# Patient Record
Sex: Male | Born: 1975 | Race: Black or African American | Hispanic: No | Marital: Married | Smoking: Current every day smoker
Health system: Southern US, Community
[De-identification: ages and names within clinical notes are randomized; demographics above are authoritative.]

## PROBLEM LIST (undated history)

## (undated) DIAGNOSIS — I739 Peripheral vascular disease, unspecified: Secondary | ICD-10-CM

## (undated) DIAGNOSIS — M8700 Idiopathic aseptic necrosis of unspecified bone: Secondary | ICD-10-CM

## (undated) DIAGNOSIS — Z21 Asymptomatic human immunodeficiency virus [HIV] infection status: Secondary | ICD-10-CM

## (undated) DIAGNOSIS — B20 Human immunodeficiency virus [HIV] disease: Secondary | ICD-10-CM

## (undated) HISTORY — PX: HERNIA REPAIR: SHX51

---

## 1999-08-19 ENCOUNTER — Emergency Department (HOSPITAL_COMMUNITY): Admission: EM | Admit: 1999-08-19 | Discharge: 1999-08-19 | Payer: Self-pay | Admitting: Emergency Medicine

## 2005-12-05 ENCOUNTER — Emergency Department (HOSPITAL_COMMUNITY): Admission: EM | Admit: 2005-12-05 | Discharge: 2005-12-05 | Payer: Self-pay | Admitting: Emergency Medicine

## 2009-08-26 ENCOUNTER — Emergency Department (HOSPITAL_BASED_OUTPATIENT_CLINIC_OR_DEPARTMENT_OTHER): Admission: EM | Admit: 2009-08-26 | Discharge: 2009-08-26 | Payer: Self-pay | Admitting: Emergency Medicine

## 2009-08-30 ENCOUNTER — Emergency Department (HOSPITAL_BASED_OUTPATIENT_CLINIC_OR_DEPARTMENT_OTHER): Admission: EM | Admit: 2009-08-30 | Discharge: 2009-08-30 | Payer: Self-pay | Admitting: Emergency Medicine

## 2010-10-26 ENCOUNTER — Emergency Department (HOSPITAL_BASED_OUTPATIENT_CLINIC_OR_DEPARTMENT_OTHER)
Admission: EM | Admit: 2010-10-26 | Discharge: 2010-10-26 | Disposition: A | Discharge status: Home / Self Care | Payer: Self-pay | Attending: Emergency Medicine | Admitting: Emergency Medicine

## 2010-10-26 ENCOUNTER — Emergency Department (INDEPENDENT_AMBULATORY_CARE_PROVIDER_SITE_OTHER): Payer: Self-pay

## 2010-10-26 DIAGNOSIS — M25559 Pain in unspecified hip: Secondary | ICD-10-CM | POA: Insufficient documentation

## 2010-10-26 DIAGNOSIS — M25519 Pain in unspecified shoulder: Secondary | ICD-10-CM | POA: Insufficient documentation

## 2010-10-26 DIAGNOSIS — M87029 Idiopathic aseptic necrosis of unspecified humerus: Secondary | ICD-10-CM

## 2010-10-26 DIAGNOSIS — M2550 Pain in unspecified joint: Secondary | ICD-10-CM | POA: Insufficient documentation

## 2011-02-03 ENCOUNTER — Encounter: Payer: Self-pay | Admitting: Family Medicine

## 2011-02-03 ENCOUNTER — Emergency Department (HOSPITAL_BASED_OUTPATIENT_CLINIC_OR_DEPARTMENT_OTHER)
Admission: EM | Admit: 2011-02-03 | Discharge: 2011-02-03 | Disposition: A | Discharge status: Home / Self Care | Payer: Self-pay | Attending: Emergency Medicine | Admitting: Emergency Medicine

## 2011-02-03 DIAGNOSIS — K047 Periapical abscess without sinus: Secondary | ICD-10-CM | POA: Insufficient documentation

## 2011-02-03 DIAGNOSIS — K029 Dental caries, unspecified: Secondary | ICD-10-CM | POA: Insufficient documentation

## 2011-02-03 DIAGNOSIS — K089 Disorder of teeth and supporting structures, unspecified: Secondary | ICD-10-CM | POA: Insufficient documentation

## 2011-02-03 MED ORDER — CLINDAMYCIN HCL 300 MG PO CAPS
300.0000 mg | ORAL_CAPSULE | Freq: Once | ORAL | Status: AC
  Administered 2011-02-03: 300 mg via ORAL

## 2011-02-03 MED ORDER — CLINDAMYCIN HCL 300 MG PO CAPS: 300.0000 mg | ORAL_CAPSULE | Freq: Three times a day (TID) | ORAL | Status: DC

## 2011-02-03 MED ORDER — CLINDAMYCIN HCL 300 MG PO CAPS: 300.0000 mg | ORAL_CAPSULE | Freq: Three times a day (TID) | ORAL | Status: AC

## 2011-02-03 MED ORDER — CLINDAMYCIN HCL 150 MG PO CAPS
ORAL_CAPSULE | ORAL | Status: AC
  Administered 2011-02-03: 300 mg via ORAL
  Filled 2011-02-03: qty 2

## 2011-02-03 MED ORDER — HYDROCODONE-ACETAMINOPHEN 5-500 MG PO TABS: 1.0000 | ORAL_TABLET | Freq: Four times a day (QID) | ORAL | Status: AC | PRN

## 2011-02-03 NOTE — ED Notes (Signed)
Pt c/o right lower tooth pain x 3 days. Pt sts he has dental appt "next week".

## 2011-02-03 NOTE — ED Provider Notes (Addendum)
History     Chief Complaint  Patient presents with  . Dental Pain   HPI 35 year old male presents to the ER with complaint of with 3 days of right lower dental pain. Patient reports this morning he noted a swelling adjacent to the sore tooth, and pushed on the area and had drainage of blood. Patient reports he has a dental appointment next week. No fever. Patient with multiple allergies to antibiotics, but does not think he is allergic to clindamycin.    History reviewed. No pertinent past medical history.  Past Surgical History  Procedure Date  . Hernia repair     No family history on file.  History  Substance Use Topics  . Smoking status: Never Smoker   . Smokeless tobacco: Not on file  . Alcohol Use: No      Review of Systems  Constitutional: Negative.   HENT: Positive for dental problem. Negative for ear pain, mouth sores, trouble swallowing, neck pain, neck stiffness and voice change.   Eyes: Negative.   Respiratory: Negative.   Cardiovascular: Negative.   Gastrointestinal: Negative.   Genitourinary: Negative.   Musculoskeletal: Negative.   Skin: Negative.   Neurological: Negative.   Hematological: Negative.   Psychiatric/Behavioral: Negative.     Physical Exam  BP 106/81  Pulse 81  Temp(Src) 98.2 F (36.8 C) (Oral)  Resp 18  Ht 6\' 3"  (1.905 m)  Wt 245 lb (111.131 kg)  BMI 30.62 kg/m2  SpO2 99%  Physical Exam  Constitutional: He appears well-developed and well-nourished.  HENT:  Head: Normocephalic and atraumatic.  Mouth/Throat:    Eyes: Conjunctivae and EOM are normal. Pupils are equal, round, and reactive to light.  Neck: Normal range of motion.  Cardiovascular: Normal rate, regular rhythm, normal heart sounds and intact distal pulses.   Pulmonary/Chest: Effort normal and breath sounds normal.  Lymphadenopathy:    He has no cervical adenopathy.    ED Course  Procedures  MDM Patient with small dental abscess at lower right first molar.  Patient instructed to keep his current dental appointment, and followup closely. Patient to be started on antibiotics and pain control. Patient updated on reasons for return to the emergency department. Patient advised to call the dental office and see if he can be seen sooner for his developing dental abscess.      Olivia Mackie, MD 02/03/11 1156  Olivia Mackie, MD 03/07/11 [phone]

## 2011-02-26 ENCOUNTER — Encounter (HOSPITAL_BASED_OUTPATIENT_CLINIC_OR_DEPARTMENT_OTHER): Payer: Self-pay | Admitting: *Deleted

## 2011-02-26 ENCOUNTER — Emergency Department (HOSPITAL_BASED_OUTPATIENT_CLINIC_OR_DEPARTMENT_OTHER)
Admission: EM | Admit: 2011-02-26 | Discharge: 2011-02-26 | Disposition: A | Discharge status: Home / Self Care | Payer: Self-pay | Attending: Emergency Medicine | Admitting: Emergency Medicine

## 2011-02-26 DIAGNOSIS — R6884 Jaw pain: Secondary | ICD-10-CM | POA: Insufficient documentation

## 2011-02-26 DIAGNOSIS — R220 Localized swelling, mass and lump, head: Secondary | ICD-10-CM | POA: Insufficient documentation

## 2011-02-26 DIAGNOSIS — K1370 Unspecified lesions of oral mucosa: Secondary | ICD-10-CM | POA: Insufficient documentation

## 2011-02-26 DIAGNOSIS — K1379 Other lesions of oral mucosa: Secondary | ICD-10-CM

## 2011-02-26 DIAGNOSIS — Z9889 Other specified postprocedural states: Secondary | ICD-10-CM | POA: Insufficient documentation

## 2011-02-26 MED ORDER — LIDOCAINE-EPINEPHRINE 2 %-1:100000 IJ SOLN
20.0000 mL | Freq: Once | INTRAMUSCULAR | Status: AC
  Administered 2011-02-26: 20 mL
  Filled 2011-02-26: qty 1

## 2011-02-26 NOTE — ED Provider Notes (Signed)
History     CSN: [id number] Arrival date & time: 02/26/2011  3:56 PM  Chief Complaint  Patient presents with  . Dental Problem   Patient is a 35 y.o. male presenting with tooth pain. The history is provided by the patient.  Dental PainThe primary symptoms include oral bleeding. Primary symptoms do not include headaches, fever or shortness of breath. The symptoms began 1 to 2 hours ago. The symptoms are unchanged. The symptoms occur constantly.  Additional symptoms include: gum swelling, gum tenderness, trismus and jaw pain.  Had dental extraction this am at [facility], no bleeding in office, since he went home has had persistent bleeding and pain is coming back, he states he had 3 teeth pulled on the R side and that the packing fell out. No ASA or anticoagulation and denies any known h/o bleeding problems.   History reviewed. No pertinent past medical history.  Past Surgical History  Procedure Date  . Hernia repair     No family history on file.  History  Substance Use Topics  . Smoking status: Never Smoker   . Smokeless tobacco: Not on file  . Alcohol Use: No      Review of Systems  Constitutional: Negative for fever and chills.  HENT: Negative for neck pain and neck stiffness.   Eyes: Negative for pain.  Respiratory: Negative for shortness of breath.   Cardiovascular: Negative for chest pain.  Gastrointestinal: Negative for abdominal pain.  Genitourinary: Negative for dysuria.  Musculoskeletal: Negative for back pain.  Skin: Negative for rash.  Neurological: Negative for headaches.  Hematological: Does not bruise/bleed easily.  All other systems reviewed and are negative.    Physical Exam  BP 131/84  Pulse 109  Temp(Src) 99 F (37.2 C) (Oral)  Resp 22  SpO2 100%  Physical Exam  Constitutional: He is oriented to person, place, and time. He appears well-developed and well-nourished.  HENT:  Head: Normocephalic and atraumatic.       S/p dental extraction  R upper and lower third molars with active bleeding at both sites, moderate trismus, moderate gingival edema both sites. .    Eyes: Conjunctivae and EOM are normal. Pupils are equal, round, and reactive to light.  Neck: Trachea normal. Neck supple. No thyromegaly present.  Cardiovascular: Normal rate, regular rhythm, S1 normal, S2 normal and normal pulses.     No systolic murmur is present   No diastolic murmur is present  Pulses:      Radial pulses are 2+ on the right side, and 2+ on the left side.  Pulmonary/Chest: Effort normal and breath sounds normal. He has no rhonchi.  Abdominal: Soft. Normal appearance and bowel sounds are normal. There is no tenderness. There is no CVA tenderness and negative Murphy's sign.  Musculoskeletal:       BLE:s Calves nontender, no cords or erythema, negative Homans sign  Neurological: He is alert and oriented to person, place, and time. He has normal strength. No cranial nerve deficit or sensory deficit. GCS eye subscore is 4. GCS verbal subscore is 5. GCS motor subscore is 6.  Skin: Skin is warm and dry. No rash noted. He is not diaphoretic. No pallor.  Psychiatric: His speech is normal.       Cooperative and appropriate    ED Course  Dental Date/Time: 02/26/2011 5:18 PM Performed by: Sunnie Nielsen Authorized by: Sunnie Nielsen Consent: Verbal consent obtained. Risks and benefits: risks, benefits and alternatives were discussed Consent given by: patient Patient understanding:  patient states understanding of the procedure being performed Patient identity confirmed: verbally with patient Time out: Immediately prior to procedure a "time out" was called to verify the correct patient, procedure, equipment, support staff and site/side marked as required. Local anesthesia used: yes Anesthesia: local infiltration Local anesthetic: lidocaine 1% with epinephrine Anesthetic total: 2 ml Patient tolerance: Patient tolerated the procedure well with no immediate  complications. Comments: Lido injected with minimization of bleeding and thrombin pads placed at both sites and PT given cotton roll to bite down and hold pressure.   7:04 PM recheck bleeding stopped with good clots on both extraction sites,  Has pain meds, is stable for d/c home.   MDM Dental bleeding s/p extractions, controlled in ED as above.  DIscharged home to f/u with his dentist and be eva,auted again for any uncontroleld bleeding      Sunnie Nielsen, MD 02/26/11 4540

## 2011-02-26 NOTE — ED Notes (Signed)
Had a tooth pulled this am. Bleeding uncontrolled.

## 2011-03-11 ENCOUNTER — Emergency Department (HOSPITAL_BASED_OUTPATIENT_CLINIC_OR_DEPARTMENT_OTHER)
Admission: EM | Admit: 2011-03-11 | Discharge: 2011-03-12 | Disposition: A | Discharge status: Home / Self Care | Payer: Self-pay | Attending: Emergency Medicine | Admitting: Emergency Medicine

## 2011-03-11 ENCOUNTER — Encounter (HOSPITAL_BASED_OUTPATIENT_CLINIC_OR_DEPARTMENT_OTHER): Payer: Self-pay | Admitting: *Deleted

## 2011-03-11 DIAGNOSIS — K047 Periapical abscess without sinus: Secondary | ICD-10-CM

## 2011-03-11 DIAGNOSIS — R220 Localized swelling, mass and lump, head: Secondary | ICD-10-CM | POA: Insufficient documentation

## 2011-03-11 DIAGNOSIS — K044 Acute apical periodontitis of pulpal origin: Secondary | ICD-10-CM | POA: Insufficient documentation

## 2011-03-11 MED ORDER — MORPHINE SULFATE 2 MG/ML IJ SOLN
2.0000 mg | Freq: Once | INTRAMUSCULAR | Status: AC
  Administered 2011-03-11: 2 mg via INTRAVENOUS
  Filled 2011-03-11: qty 1

## 2011-03-11 MED ORDER — ONDANSETRON HCL 4 MG/2ML IJ SOLN
4.0000 mg | Freq: Once | INTRAMUSCULAR | Status: AC
  Administered 2011-03-11: 4 mg via INTRAVENOUS
  Filled 2011-03-11: qty 2

## 2011-03-11 MED ORDER — SODIUM CHLORIDE 0.9 % IV BOLUS (SEPSIS)
1000.0000 mL | Freq: Once | INTRAVENOUS | Status: AC
  Administered 2011-03-11: 1000 mL via INTRAVENOUS

## 2011-03-11 NOTE — ED Notes (Signed)
Pt presents to ED today with dental and jaw pain s/p tooth extraction x3 from 8/3 that began yesterday.  Pt has been taking rx pain meds with no relief

## 2011-03-12 ENCOUNTER — Emergency Department (INDEPENDENT_AMBULATORY_CARE_PROVIDER_SITE_OTHER): Payer: Self-pay

## 2011-03-12 ENCOUNTER — Encounter (HOSPITAL_BASED_OUTPATIENT_CLINIC_OR_DEPARTMENT_OTHER): Payer: Self-pay | Admitting: Emergency Medicine

## 2011-03-12 DIAGNOSIS — R599 Enlarged lymph nodes, unspecified: Secondary | ICD-10-CM

## 2011-03-12 DIAGNOSIS — K044 Acute apical periodontitis of pulpal origin: Secondary | ICD-10-CM

## 2011-03-12 DIAGNOSIS — L03221 Cellulitis of neck: Secondary | ICD-10-CM

## 2011-03-12 DIAGNOSIS — K08109 Complete loss of teeth, unspecified cause, unspecified class: Secondary | ICD-10-CM

## 2011-03-12 LAB — BASIC METABOLIC PANEL
BUN: 7 mg/dL (ref 6–23)
CO2: 27 mEq/L (ref 19–32)
Calcium: 9.9 mg/dL (ref 8.4–10.5)
Chloride: 98 mEq/L (ref 96–112)
GFR calc Af Amer: >60 mL/min (ref >60)
GFR calc non Af Amer: >60 mL/min (ref >60)
Sodium: 136 mEq/L (ref 135–145)

## 2011-03-12 LAB — CBC
MCH: 26.5 pg (ref 26.0–34.0)
MCHC: 34.2 g/dL (ref 30.0–36.0)
MCV: 77.7 fL — ABNORMAL LOW (ref 78.0–100.0)

## 2011-03-12 LAB — DIFFERENTIAL
Eosinophils Relative: 0 % (ref 0–5)
Monocytes Absolute: 0.6 10*3/uL (ref 0.1–1.0)
Monocytes Relative: 5 % (ref 3–12)
Neutrophils Relative %: 77 % (ref 43–77)

## 2011-03-12 MED ORDER — MORPHINE SULFATE 4 MG/ML IJ SOLN
INTRAMUSCULAR | Status: AC
  Administered 2011-03-12: 4 mg via INTRAVENOUS
  Filled 2011-03-12: qty 1

## 2011-03-12 MED ORDER — SODIUM CHLORIDE 0.9 % IV SOLN
INTRAVENOUS | Status: DC
  Administered 2011-03-12: 01:00:00 via INTRAVENOUS

## 2011-03-12 MED ORDER — ONDANSETRON HCL 4 MG PO TABS: 8.0000 mg | ORAL_TABLET | Freq: Four times a day (QID) | ORAL | Status: AC

## 2011-03-12 MED ORDER — IOHEXOL 300 MG/ML  SOLN
80.0000 mL | Freq: Once | INTRAMUSCULAR | Status: AC | PRN
  Administered 2011-03-12: 80 mL via INTRAVENOUS

## 2011-03-12 MED ORDER — CLINDAMYCIN PHOSPHATE 900 MG/50ML IV SOLN
900.0000 mg | Freq: Once | INTRAVENOUS | Status: AC
  Administered 2011-03-12: 900 mg via INTRAVENOUS
  Filled 2011-03-12: qty 50

## 2011-03-12 MED ORDER — MORPHINE SULFATE 4 MG/ML IJ SOLN
4.0000 mg | Freq: Once | INTRAMUSCULAR | Status: AC
  Administered 2011-03-12: 4 mg via INTRAVENOUS

## 2011-03-12 MED ORDER — KETOROLAC TROMETHAMINE 30 MG/ML IJ SOLN
30.0000 mg | Freq: Once | INTRAMUSCULAR | Status: AC
  Administered 2011-03-12: 30 mg via INTRAVENOUS
  Filled 2011-03-12: qty 1

## 2011-03-12 MED ORDER — CLINDAMYCIN HCL 300 MG PO CAPS: 900.0000 mg | ORAL_CAPSULE | Freq: Three times a day (TID) | ORAL | Status: AC

## 2011-03-12 MED ORDER — HYDROCODONE-ACETAMINOPHEN 5-500 MG PO TABS: 1.0000 | ORAL_TABLET | Freq: Four times a day (QID) | ORAL | Status: AC | PRN

## 2011-03-12 NOTE — ED Notes (Signed)
Pt continues to c/o pain, informed MD who states that she is reluctant to give any more meds at this time, d/t pt's inability to open his jaw very far (poor airway management).  Pt was informed that we will give him toradol and wait for next narc admin.

## 2011-03-12 NOTE — ED Notes (Signed)
Pt called out requesting pain medication, spoke to provider and she did not wish to give pt any additional pain medication at this time.

## 2011-03-12 NOTE — ED Provider Notes (Signed)
History     CSN: [id number] Arrival date & time: 03/11/2011  9:47 PM  Chief Complaint  Patient presents with  . Dental Pain  . Oral Swelling   Patient is a 35 y.o. male presenting with tooth pain. The history is provided by the patient. No language interpreter was used.  Dental PainThe primary symptoms include mouth pain and dental injury. Primary symptoms do not include fever or shortness of breath. The symptoms began 3 to 5 days ago. The symptoms are worsening. The symptoms are new. The symptoms occur constantly.  Mouth pain began more than 1 week ago. Mouth pain occurs constantly. Mouth pain is worsening. At its highest the mouth pain was at 10/10. The mouth pain is currently at 8/10.  Additional symptoms include: trismus, jaw pain and facial swelling. Additional symptoms do not include: trouble swallowing, pain with swallowing, excessive salivation, dry mouth and drooling.    History reviewed. No pertinent past medical history.  Past Surgical History  Procedure Date  . Hernia repair     History reviewed. No pertinent family history.  History  Substance Use Topics  . Smoking status: Never Smoker   . Smokeless tobacco: Not on file  . Alcohol Use: No      Review of Systems  Constitutional: Negative for fever.  HENT: Positive for facial swelling. Negative for drooling and trouble swallowing.   Eyes: Negative.   Respiratory: Negative for shortness of breath.   Cardiovascular: Negative.   Gastrointestinal: Negative.   Genitourinary: Negative.   Musculoskeletal: Negative.   Skin: Negative.   Neurological: Negative.   Hematological: Negative.   Psychiatric/Behavioral: Negative.     Physical Exam  BP 119/68  Pulse 103  Temp(Src) 98.7 F (37.1 C) (Oral)  Resp 16  SpO2 97%  Physical Exam  Constitutional: He is oriented to person, place, and time. He appears well-developed and well-nourished. No distress.  HENT:  Head: Normocephalic and atraumatic. There is trismus  in the jaw.       Swelling on the right side of the face extending submandibularly. Exam of the oropharynx is limited secondary to patient ability to open his mouth.  Eyes: Conjunctivae and EOM are normal. Pupils are equal, round, and reactive to light.  Neck: Normal range of motion.       Patient does have some right-sided submandibular swelling.  Cardiovascular: Normal rate, regular rhythm, normal heart sounds and intact distal pulses.  Exam reveals no gallop and no friction rub.   No murmur heard. Pulmonary/Chest: Effort normal and breath sounds normal. No respiratory distress. He has no wheezes. He has no rales.  Abdominal: Soft. Bowel sounds are normal. He exhibits no distension. There is no tenderness. There is no rebound and no guarding.  Musculoskeletal: Normal range of motion. He exhibits no edema and no tenderness.  Neurological: He is alert and oriented to person, place, and time. No cranial nerve deficit. He exhibits normal muscle tone. Coordination normal.  Skin: Skin is warm and dry.  Psychiatric: He has a normal mood and affect.    ED Course  Procedures  MDM Patient was examined and given his recent history of tooth extraction as well as his trismus patient did have a CBC and a renal. His CBC showed a slightly elevated white count of 13.1. Renal panel was within normal limits and CT of the neck with IV contrast was obtained. Patient did have findings of phlegmon in the right pterygoid as well as around the submandibular gland. Patient stated that  he had had his procedure performed at one of the dental practices associated with [facility]. Patient was given clindamycin 900 mg IV and blood cultures were drawn. Patient refused transport to any of the [facility] facilities for additional admission and continuing of IV antibiotics. Patient was completely hemodynamically stable and did not have any difficulty breathing or handling his secretions. Patient stated he has been this way  for a week. He has been able to tolerate by mouth intake. Patient was treated for his pain and received IV fluids. I spoke to the dentist on call for dentistry and oral surgery service at [facility]. Though he was unable to access the patient's records, Dr. Laural Benes did review the case with me. Patient had remained stable for his entire time here and described having his symptoms for at least a week. Patient was able to tolerate oral intake and was felt safe to be discharged home with clindamycin 900 mg by mouth 3 times a day for 7 days. Patient was told that in the morning he would need to followup with either his regular dentist or that he could present at 8 AM the chart at second-floor [facility] where the physician there spoke with this evening would be comfortable evaluating the patient. Patient was comfortable wit plan and agreed with plan for discharge. Patient was discharged home in good condition.  Assessment: 35 year old male with dental infection.  Plan: As described in MDM. Patient discharged home with prescriptions for 7 days of clindamycin as well as Vicodin and Zofran.     Cyndra Numbers, MD 03/12/11 [phone]

## 2011-03-12 NOTE — ED Notes (Signed)
Pt called out requesting additional pain medication, stating that pain is 10/10.  Spoke with MD.

## 2011-03-18 LAB — CULTURE, BLOOD (ROUTINE X 2)
Culture: NO GROWTH
Culture: NO GROWTH

## 2011-08-03 ENCOUNTER — Emergency Department (INDEPENDENT_AMBULATORY_CARE_PROVIDER_SITE_OTHER): Payer: PRIVATE HEALTH INSURANCE

## 2011-08-03 ENCOUNTER — Encounter (HOSPITAL_BASED_OUTPATIENT_CLINIC_OR_DEPARTMENT_OTHER): Payer: Self-pay | Admitting: *Deleted

## 2011-08-03 ENCOUNTER — Emergency Department (HOSPITAL_BASED_OUTPATIENT_CLINIC_OR_DEPARTMENT_OTHER)
Admission: EM | Admit: 2011-08-03 | Discharge: 2011-08-03 | Disposition: A | Discharge status: Home / Self Care | Payer: PRIVATE HEALTH INSURANCE | Attending: Emergency Medicine | Admitting: Emergency Medicine

## 2011-08-03 DIAGNOSIS — W57XXXA Bitten or stung by nonvenomous insect and other nonvenomous arthropods, initial encounter: Secondary | ICD-10-CM

## 2011-08-03 DIAGNOSIS — M25519 Pain in unspecified shoulder: Secondary | ICD-10-CM

## 2011-08-03 DIAGNOSIS — M79609 Pain in unspecified limb: Secondary | ICD-10-CM

## 2011-08-03 DIAGNOSIS — S90569A Insect bite (nonvenomous), unspecified ankle, initial encounter: Secondary | ICD-10-CM | POA: Insufficient documentation

## 2011-08-03 DIAGNOSIS — M87029 Idiopathic aseptic necrosis of unspecified humerus: Secondary | ICD-10-CM | POA: Insufficient documentation

## 2011-08-03 MED ORDER — HYDROCODONE-ACETAMINOPHEN 5-325 MG PO TABS: 2.0000 | ORAL_TABLET | ORAL | Status: AC | PRN

## 2011-08-03 MED ORDER — HYDROCODONE-ACETAMINOPHEN 5-325 MG PO TABS
2.0000 | ORAL_TABLET | Freq: Once | ORAL | Status: AC
  Administered 2011-08-03: 2 via ORAL
  Filled 2011-08-03: qty 2

## 2011-08-03 NOTE — ED Provider Notes (Signed)
History     CSN: [id number]  Arrival date & time 08/03/11  1814   First MD Initiated Contact with Patient 08/03/11 1940      Chief Complaint  Patient presents with  . Rash    (Consider location/radiation/quality/duration/timing/severity/associated sxs/prior treatment) Patient is a 36 y.o. male presenting with shoulder pain. The history is provided by the patient. No language interpreter was used.  Shoulder Pain This is a new problem. The current episode started today. The problem occurs constantly. The problem has been unchanged. Associated symptoms include joint swelling and a rash. The symptoms are aggravated by nothing. He has tried nothing for the symptoms. The treatment provided moderate relief.  Pr complains of pain in his shoulder and rash on lower legs.  Pt complains of ithching  History reviewed. No pertinent past medical history.  Past Surgical History  Procedure Date  . Hernia repair     No family history on file.  History  Substance Use Topics  . Smoking status: Never Smoker   . Smokeless tobacco: Not on file  . Alcohol Use: No      Review of Systems  Musculoskeletal: Positive for joint swelling.  Skin: Positive for rash.  All other systems reviewed and are negative.    Allergies  Amoxicillin; Penicillins; Bactrim; and Sulfa antibiotics  Home Medications   Current Outpatient Rx  Name Route Sig Dispense Refill  . TRIAMCINOLONE ACETONIDE 0.5 % EX CREA Topical Apply topically 2 (two) times daily.        BP 129/77  Pulse 82  Temp(Src) 98 F (36.7 C) (Oral)  Resp 20  SpO2 98%  Physical Exam  Nursing note and vitals reviewed. Constitutional: He appears well-developed.  HENT:  Head: Normocephalic and atraumatic.  Right Ear: External ear normal.  Eyes: Pupils are equal, round, and reactive to light.  Neck: Normal range of motion.  Cardiovascular: Normal rate.   Pulmonary/Chest: Effort normal.  Musculoskeletal: He exhibits tenderness.    Neurological: He is alert.  Skin: Rash noted.  tender left humerus,  Decreased range of motion shoulder,   bilat lower legs,  Multiple insect bites  ED Course  Procedures (including critical care time)  Labs Reviewed - No data to display Dg Humerus Left  08/03/2011  *RADIOLOGY REPORT*  Clinical Data: Left mid humerus pain  LEFT HUMERUS - 2+ VIEW  Comparison: 10/26/2010 right shoulder  Findings: Chronic appearing sclerosis, collapse, and healed deformity of the left humeral head consistent with prior avascular necrosis.  No malalignment or fracture.  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  Original Report Authenticated By: Judie Petit. Ruel Favors, M.D.     No diagnosis found.    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   Pt will follow up next week.       Langston Masker, [place] 08/06/11 1212

## 2011-08-03 NOTE — ED Notes (Signed)
Pt also c/o rash to bilateral feet for few days.

## 2011-08-03 NOTE — ED Notes (Signed)
Pain in his left arm x 4 days. Blisters on his feet for awhile.

## 2011-08-07 NOTE — ED Provider Notes (Signed)
Medical screening examination/treatment/procedure(s) were performed by non-physician practitioner and as supervising physician I was immediately available for consultation/collaboration.   Forbes Cellar, MD 08/07/11 1315

## 2011-11-30 ENCOUNTER — Emergency Department (HOSPITAL_BASED_OUTPATIENT_CLINIC_OR_DEPARTMENT_OTHER)
Admission: EM | Admit: 2011-11-30 | Discharge: 2011-11-30 | Disposition: A | Discharge status: Home / Self Care | Payer: PRIVATE HEALTH INSURANCE | Attending: Emergency Medicine | Admitting: Emergency Medicine

## 2011-11-30 ENCOUNTER — Encounter (HOSPITAL_BASED_OUTPATIENT_CLINIC_OR_DEPARTMENT_OTHER): Payer: Self-pay | Admitting: Family Medicine

## 2011-11-30 DIAGNOSIS — M25559 Pain in unspecified hip: Secondary | ICD-10-CM | POA: Insufficient documentation

## 2011-11-30 DIAGNOSIS — M25551 Pain in right hip: Secondary | ICD-10-CM

## 2011-11-30 DIAGNOSIS — M87051 Idiopathic aseptic necrosis of right femur: Secondary | ICD-10-CM

## 2011-11-30 DIAGNOSIS — R109 Unspecified abdominal pain: Secondary | ICD-10-CM | POA: Insufficient documentation

## 2011-11-30 DIAGNOSIS — M8708 Idiopathic aseptic necrosis of bone, other site: Secondary | ICD-10-CM | POA: Insufficient documentation

## 2011-11-30 MED ORDER — HYDROCODONE-ACETAMINOPHEN 5-500 MG PO TABS: 1.0000 | ORAL_TABLET | Freq: Four times a day (QID) | ORAL | Status: AC | PRN

## 2011-11-30 NOTE — ED Notes (Signed)
Pt c/o right hip and groin pain into right upper thigh x 2 wks. Pt denies injury. Pt ambulatory.

## 2011-11-30 NOTE — ED Provider Notes (Signed)
History     CSN: [id number]  Arrival date & time 11/30/11  1322   First MD Initiated Contact with Patient 11/30/11 1533      Chief Complaint  Patient presents with  . Groin Pain  . Hip Pain    (Consider location/radiation/quality/duration/timing/severity/associated sxs/prior treatment) HPI Comments: History of avn of shoulder and hip joints.  The right hip has been acting up lately.  No new injury or trauma.    Patient is a 36 y.o. male presenting with groin pain and hip pain. The history is provided by the patient.  Groin Pain This is a recurrent problem. The current episode started 2 days ago. The problem occurs constantly. The problem has been gradually worsening. The symptoms are aggravated by nothing. The symptoms are relieved by nothing. Treatments tried: motrin. The treatment provided mild relief.  Hip Pain    History reviewed. No pertinent past medical history.  Past Surgical History  Procedure Date  . Hernia repair     No family history on file.  History  Substance Use Topics  . Smoking status: Never Smoker   . Smokeless tobacco: Not on file  . Alcohol Use: No      Review of Systems  All other systems reviewed and are negative.    Allergies  Amoxicillin; Penicillins; Bactrim; and Sulfa antibiotics  Home Medications   Current Outpatient Rx  Name Route Sig Dispense Refill  . ACETAMINOPHEN 500 MG PO TABS Oral Take 1,000 mg by mouth every 6 (six) hours as needed. For pain    . IBUPROFEN 200 MG PO TABS Oral Take 800 mg by mouth 3 (three) times daily as needed. For pain      BP 149/97  Pulse 95  Temp(Src) 98.1 F (36.7 C) (Oral)  Resp 20  Ht 6\' 3"  (1.905 m)  Wt 245 lb (111.131 kg)  BMI 30.62 kg/m2  SpO2 99%  Physical Exam  Nursing note and vitals reviewed. Constitutional: He is oriented to person, place, and time. He appears well-developed and well-nourished. No distress.  HENT:  Head: Normocephalic and atraumatic.  Neck: Normal range of  motion. Neck supple.  Musculoskeletal:       The right hip is ttp.  There is pain with rom.  The right leg is neurovasc intact distally.  Neurological: He is alert and oriented to person, place, and time.  Skin: Skin is warm and dry. He is not diaphoretic.    ED Course  Procedures (including critical care time)  Labs Reviewed - No data to display No results found.   No diagnosis found.    MDM  The patient has a history of avn but no surgery or intervention is in the near future.  Will prescribe lortab, follow up prn is not getting better.        Geoffery Lyons, MD 11/30/11 [phone]

## 2011-11-30 NOTE — Discharge Instructions (Signed)
Hip Pain  The hips join the upper legs to the lower pelvis. The bones, cartilage, tendons, and muscles of the hip joint perform a lot of work each day holding your body weight and allowing you to move around.  Hip pain is a common symptom. It can range from a minor ache to severe pain on 1 or both hips. Pain may be felt on the inside of the hip joint near the groin, or the outside near the buttocks and upper thigh. There may be swelling or stiffness as well. It occurs more often when a person walks or performs activity. There are many reasons hip pain can develop.  CAUSES   It is important to work with your caregiver to identify the cause since many conditions can impact the bones, cartilage, muscles, and tendons of the hips. Causes for hip pain include:   Broken (fractured) bones.   Separation of the thighbone from the hip socket (dislocation).   Torn cartilage of the hip joint.   Swelling (inflammation) of a tendon (tendonitis), the sac within the hip joint (bursitis), or a joint.   A weakening in the abdominal wall (hernia), affecting the nerves to the hip.   Arthritis in the hip joint or lining of the hip joint.   Pinched nerves in the back, hip, or upper thigh.   A bulging disc in the spine (herniated disc).   Rarely, bone infection or cancer.  DIAGNOSIS   The location of your hip pain will help your caregiver understand what may be causing the pain. A diagnosis is based on your medical history, your symptoms, results from your physical exam, and results from diagnostic tests. Diagnostic tests may include X-ray exams, a computerized magnetic scan (magnetic resonance imaging, MRI), or bone scan.  TREATMENT   Treatment will depend on the cause of your hip pain. Treatment may include:   Limiting activities and resting until symptoms improve.   Crutches or other walking supports (a cane or brace).   Ice, elevation, and compression.   Physical therapy or home exercises.   Shoe inserts or special  shoes.   Losing weight.   Medications to reduce pain.   Undergoing surgery.  HOME CARE INSTRUCTIONS    Only take over-the-counter or prescription medicines for pain, discomfort, or fever as directed by your caregiver.   Put ice on the injured area:   Put ice in a plastic bag.   Place a towel between your skin and the bag.   Leave the ice on for 15 to 20 minutes at a time, 3 to 4 times a day.   Keep your leg raised (elevated) when possible to lessen swelling.   Avoid activities that cause pain.   Follow specific exercises as directed by your caregiver.   Sleep with a pillow between your legs on your most comfortable side.   Record how often you have hip pain, the location of the pain, and what it feels like. This information may be helpful to you and your caregiver.   Ask your caregiver about returning to work or sports and whether you should drive.   Follow up with your caregiver for further exams, therapy, or testing as directed.  SEEK MEDICAL CARE IF:    Your pain or swelling continues or worsens after 1 week.   You are feeling unwell or have chills.   You have increasing difficulty with walking.   You have a loss of sensation or other new symptoms.   You have questions   or concerns.  SEEK IMMEDIATE MEDICAL CARE IF:    You cannot put weight on the affected hip.   You have fallen.   You have a sudden increase in pain and swelling in your hip.   You have a fever.  MAKE SURE YOU:    Understand these instructions.   Will watch your condition.   Will get help right away if you are not doing well or get worse.  Document Released: 12/30/2009 Document Revised: 07/01/2011 Document Reviewed: 12/30/2009  [employer] Patient Information 2012 [employer]

## 2012-05-28 ENCOUNTER — Encounter (HOSPITAL_BASED_OUTPATIENT_CLINIC_OR_DEPARTMENT_OTHER): Payer: Self-pay | Admitting: Emergency Medicine

## 2012-05-28 ENCOUNTER — Emergency Department (HOSPITAL_BASED_OUTPATIENT_CLINIC_OR_DEPARTMENT_OTHER)
Admission: EM | Admit: 2012-05-28 | Discharge: 2012-05-28 | Disposition: A | Discharge status: Home / Self Care | Payer: Self-pay | Attending: Emergency Medicine | Admitting: Emergency Medicine

## 2012-05-28 DIAGNOSIS — K0889 Other specified disorders of teeth and supporting structures: Secondary | ICD-10-CM

## 2012-05-28 DIAGNOSIS — K089 Disorder of teeth and supporting structures, unspecified: Secondary | ICD-10-CM | POA: Insufficient documentation

## 2012-05-28 MED ORDER — OXYCODONE-ACETAMINOPHEN 5-325 MG PO TABS
1.0000 | ORAL_TABLET | Freq: Once | ORAL | Status: AC
  Administered 2012-05-28: 1 via ORAL
  Filled 2012-05-28 (×2): qty 1

## 2012-05-28 MED ORDER — CLINDAMYCIN HCL 150 MG PO CAPS: 150.0000 mg | ORAL_CAPSULE | Freq: Four times a day (QID) | ORAL | Status: DC

## 2012-05-28 MED ORDER — OXYCODONE-ACETAMINOPHEN 5-325 MG PO TABS: 1.0000 | ORAL_TABLET | Freq: Four times a day (QID) | ORAL | Status: DC | PRN

## 2012-05-28 NOTE — ED Notes (Signed)
Pt c/o left lower dental pain, left lower jaw pain x 2 days.   No obvious swelling noted.  No injuries.

## 2012-05-28 NOTE — ED Provider Notes (Signed)
History     CSN: [id number]  Arrival date & time 05/28/12  0915   First MD Initiated Contact with Patient 05/28/12 [phone]      Chief Complaint  Patient presents with  . Dental Pain    (Consider location/radiation/quality/duration/timing/severity/associated sxs/prior treatment) HPI Pt presents with pain in left lower molar.  States pain began 2 days ago, yesterday increased.  Pain with chewing.  No fever, no difficulty swallowing or breathing.  Has not seen dentist.  There are no other associated systemic symptoms, there are no other alleviating or modifying factors.   No past medical history on file.  Past Surgical History  Procedure Date  . Hernia repair     No family history on file.  History  Substance Use Topics  . Smoking status: Never Smoker   . Smokeless tobacco: Not on file  . Alcohol Use: No      Review of Systems ROS reviewed and all otherwise negative except for mentioned in HPI  Allergies  Amoxicillin; Penicillins; Bactrim; and Sulfa antibiotics  Home Medications   Current Outpatient Rx  Name  Route  Sig  Dispense  Refill  . ACETAMINOPHEN 500 MG PO TABS   Oral   Take 1,000 mg by mouth every 6 (six) hours as needed. For pain         . CLINDAMYCIN HCL 150 MG PO CAPS   Oral   Take 1 capsule (150 mg total) by mouth every 6 (six) hours.   28 capsule   0   . IBUPROFEN 200 MG PO TABS   Oral   Take 800 mg by mouth 3 (three) times daily as needed. For pain         . OXYCODONE-ACETAMINOPHEN 5-325 MG PO TABS   Oral   Take 1-2 tablets by mouth every 6 (six) hours as needed for pain.   15 tablet   0     BP 149/98  Pulse 91  Temp 98.4 F (36.9 C) (Oral)  Resp 16  SpO2 98% Vitals reviewed Physical Exam Physical Examination: General appearance - alert, well appearing, and in no distress Mental status - alert, oriented to person, place, and time Eyes - no scleral icterus, no conjunctival injection Mouth - mucous membranes moist, pharynx  normal without lesions, posterior lower molar erupting through gums- no focal area of abscess Neck - supple, no significant adenopathy Extremities - peripheral pulses normal, no pedal edema, no clubbing or cyanosis Skin - normal coloration and turgor, no rashes,  ED Course  Procedures (including critical care time)  Labs Reviewed - No data to display No results found.   1. Pain, dental       MDM  Pt presenting with c/o dental pain.  No sign of abscess or other complication.  Left lower posterior molar appears to be erupting through gum.  Pt started on clindamycin due to abx allergies,  Given rx for pain medications.  Strongly encouraged to f/u with dentist.  Discharged with strict return precautions.  Pt agreeable with plan.        Ethelda Chick, MD 05/28/12 [phone]

## 2012-06-11 ENCOUNTER — Emergency Department (HOSPITAL_BASED_OUTPATIENT_CLINIC_OR_DEPARTMENT_OTHER)
Admission: EM | Admit: 2012-06-11 | Discharge: 2012-06-11 | Disposition: A | Discharge status: Home / Self Care | Payer: Self-pay | Attending: Emergency Medicine | Admitting: Emergency Medicine

## 2012-06-11 ENCOUNTER — Encounter (HOSPITAL_BASED_OUTPATIENT_CLINIC_OR_DEPARTMENT_OTHER): Payer: Self-pay | Admitting: *Deleted

## 2012-06-11 DIAGNOSIS — M25519 Pain in unspecified shoulder: Secondary | ICD-10-CM

## 2012-06-11 DIAGNOSIS — Z87828 Personal history of other (healed) physical injury and trauma: Secondary | ICD-10-CM | POA: Insufficient documentation

## 2012-06-11 DIAGNOSIS — Z8679 Personal history of other diseases of the circulatory system: Secondary | ICD-10-CM | POA: Insufficient documentation

## 2012-06-11 DIAGNOSIS — Z791 Long term (current) use of non-steroidal anti-inflammatories (NSAID): Secondary | ICD-10-CM | POA: Insufficient documentation

## 2012-06-11 DIAGNOSIS — M25559 Pain in unspecified hip: Secondary | ICD-10-CM

## 2012-06-11 HISTORY — DX: Peripheral vascular disease, unspecified: I73.9

## 2012-06-11 MED ORDER — OXYCODONE-ACETAMINOPHEN 5-325 MG PO TABS
2.0000 | ORAL_TABLET | Freq: Once | ORAL | Status: AC
  Administered 2012-06-11: 2 via ORAL
  Filled 2012-06-11 (×2): qty 2

## 2012-06-11 MED ORDER — OXYCODONE-ACETAMINOPHEN 5-325 MG PO TABS: 1.0000 | ORAL_TABLET | Freq: Four times a day (QID) | ORAL | Status: DC | PRN

## 2012-06-11 NOTE — ED Notes (Signed)
Pt presents to ED today with pain to right hip that has been on going for over 1 month.  Pt is followed by neurologist for same.  Pt reports no new injury or trauma to hip,  Pt has no obvious deformity or injury

## 2012-06-11 NOTE — ED Provider Notes (Addendum)
History     CSN: [id number]  Arrival date & time 06/11/12  1352   First MD Initiated Contact with Patient 06/11/12 1409      Chief Complaint  Patient presents with  . Hip Pain    (Consider location/radiation/quality/duration/timing/severity/associated sxs/prior treatment) Patient is a 36 y.o. male presenting with hip pain. The history is provided by the patient.  Hip Pain Pertinent negatives include no chest pain, no abdominal pain, no headaches and no shortness of breath.  pt c/o right hip and right shoulder pain in past week. Constant, dull, worse w walking/use. No radiation. No numbness or weakness. No swelling. No redness. No fever or chills. Denies any recent trauma or fall. 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  Also indicates remote hx right rotator cuff injury.      Past Medical History  Diagnosis Date  . 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 Hernia repair     No family history on file.  History  Substance Use Topics  . Smoking status: Never Smoker   . Smokeless tobacco: Not on file  . Alcohol Use: No      Review of Systems  Constitutional: Negative for fever.  HENT: Negative for neck pain.   Respiratory: Negative for shortness of breath.   Cardiovascular: Negative for chest pain.  Gastrointestinal: Negative for abdominal pain.  Genitourinary: Negative for flank pain.  Musculoskeletal: Negative for back pain.  Skin: Negative for rash.  Neurological: Negative for weakness, numbness and headaches.  Hematological: Does not bruise/bleed easily.  Psychiatric/Behavioral: Negative for confusion.    Allergies  Amoxicillin; Penicillins; Bactrim; and Sulfa antibiotics  Home Medications   Current Outpatient Rx  Name  Route  Sig  Dispense  Refill  . CYCLOBENZAPRINE HCL 10 MG PO TABS   Oral   Take  10 mg by mouth 3 (three) times daily as needed.         Marland Kitchen NAPROXEN SODIUM 220 MG PO TABS   Oral   Take 220 mg by mouth 2 (two) times daily with a meal.         . ACETAMINOPHEN 500 MG PO TABS   Oral   Take 1,000 mg by mouth every 6 (six) hours as needed. For pain         . CLINDAMYCIN HCL 150 MG PO CAPS   Oral   Take 1 capsule (150 mg total) by mouth every 6 (six) hours.   28 capsule   0   . IBUPROFEN 200 MG PO TABS   Oral   Take 800 mg by mouth 3 (three) times daily as needed. For pain         . OXYCODONE-ACETAMINOPHEN 5-325 MG PO TABS   Oral   Take 1-2 tablets by mouth every 6 (six) hours as needed for pain.   15 tablet   0     BP 128/82  Pulse 117  Temp 97.9 F (36.6 C) (Oral)  Resp 16  Ht 6\' 3"  (1.905 m)  Wt 255 lb (115.667 kg)  BMI 31.87 kg/m2  SpO2 99%  Physical Exam  Nursing note and vitals reviewed. Constitutional: He is oriented to person, place, and time. He appears well-developed and well-nourished. No distress.  HENT:  Head: Atraumatic.  Eyes: Conjunctivae normal are normal. No scleral icterus.  Neck: Neck supple. No tracheal  deviation present.  Cardiovascular: Normal rate.   Pulmonary/Chest: Effort normal. No accessory muscle usage. No respiratory distress.  Abdominal: He exhibits no distension.  Musculoskeletal: Normal range of motion. He exhibits no edema and no tenderness.       Good rom right shoulder/elbow, and right hip knee. Mild click w rom at hip. No erythema, swelling, or increased warmth. Distal pulses palp.    Neurological: He is alert and oriented to person, place, and time.       Motor intact bil. Steady gait.   Skin: Skin is warm and dry.  Psychiatric: He has a normal mood and affect.    ED Course  Procedures (including critical care time)     MDM  Percocet po (pt has ride, does not have to drive), no meds pta.   Reviewed nursing notes and prior charts for additional history.   Reviewed prior films of hip.   No  recent injury or trauma.   Hr 92 rr 16.       Suzi Roots, MD 06/11/12 1431  Suzi Roots, MD 06/11/12 [phone]

## 2012-06-11 NOTE — ED Notes (Signed)
Pain in right hip for a couple months. Hass seen neurologist and been dx'd with ?vascular necrosis.

## 2012-07-18 ENCOUNTER — Emergency Department (HOSPITAL_BASED_OUTPATIENT_CLINIC_OR_DEPARTMENT_OTHER)
Admission: EM | Admit: 2012-07-18 | Discharge: 2012-07-18 | Disposition: A | Discharge status: Home / Self Care | Payer: Self-pay | Attending: Emergency Medicine | Admitting: Emergency Medicine

## 2012-07-18 ENCOUNTER — Encounter (HOSPITAL_BASED_OUTPATIENT_CLINIC_OR_DEPARTMENT_OTHER): Payer: Self-pay | Admitting: Family Medicine

## 2012-07-18 DIAGNOSIS — K089 Disorder of teeth and supporting structures, unspecified: Secondary | ICD-10-CM | POA: Insufficient documentation

## 2012-07-18 DIAGNOSIS — I7389 Other specified peripheral vascular diseases: Secondary | ICD-10-CM | POA: Insufficient documentation

## 2012-07-18 DIAGNOSIS — F17200 Nicotine dependence, unspecified, uncomplicated: Secondary | ICD-10-CM | POA: Insufficient documentation

## 2012-07-18 DIAGNOSIS — K0889 Other specified disorders of teeth and supporting structures: Secondary | ICD-10-CM

## 2012-07-18 DIAGNOSIS — Z79899 Other long term (current) drug therapy: Secondary | ICD-10-CM | POA: Insufficient documentation

## 2012-07-18 MED ORDER — HYDROCODONE-ACETAMINOPHEN 5-325 MG PO TABS: 2.0000 | ORAL_TABLET | ORAL | Status: AC | PRN

## 2012-07-18 MED ORDER — CLINDAMYCIN HCL 150 MG PO CAPS: 150.0000 mg | ORAL_CAPSULE | Freq: Four times a day (QID) | ORAL | Status: DC

## 2012-07-18 MED ORDER — HYDROCODONE-ACETAMINOPHEN 5-325 MG PO TABS
2.0000 | ORAL_TABLET | Freq: Once | ORAL | Status: AC
  Administered 2012-07-18: 2 via ORAL
  Filled 2012-07-18: qty 2

## 2012-07-18 NOTE — ED Provider Notes (Signed)
Medical screening examination/treatment/procedure(s) were performed by non-physician practitioner and as supervising physician I was immediately available for consultation/collaboration.  Jones Skene, M.D.     Jones Skene, MD 07/18/12 1605

## 2012-07-18 NOTE — ED Notes (Signed)
Pt c/o left lower back tooth pain x 2 days.

## 2012-07-18 NOTE — ED Provider Notes (Signed)
History     CSN: [id number]  Arrival date & time 07/18/12  1022   First MD Initiated Contact with Patient 07/18/12 1259      Chief Complaint  Patient presents with  . Dental Pain    (Consider location/radiation/quality/duration/timing/severity/associated sxs/prior treatment) Patient is a 36 y.o. male presenting with tooth pain. The history is provided by the patient. No language interpreter was used.  Dental PainThe primary symptoms include mouth pain and dental injury. The symptoms began 2 days ago. The symptoms are worsening. The symptoms are new. The symptoms occur constantly.  Additional symptoms include: gum swelling and gum tenderness.  Pt complains of pain from a tooth  Past Medical History  Diagnosis Date  . 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 Hernia repair     No family history on file.  History  Substance Use Topics  . Smoking status: Current Every Day Smoker  . Smokeless tobacco: Not on file  . Alcohol Use: Yes      Review of Systems  All other systems reviewed and are negative.    Allergies  Amoxicillin; Penicillins; Bactrim; and Sulfa antibiotics  Home Medications   Current Outpatient Rx  Name  Route  Sig  Dispense  Refill  . ACETAMINOPHEN 500 MG PO TABS   Oral   Take 1,000 mg by mouth every 6 (six) hours as needed. For pain         . CLINDAMYCIN HCL 150 MG PO CAPS   Oral   Take 1 capsule (150 mg total) by mouth every 6 (six) hours.   28 capsule   0   . CYCLOBENZAPRINE HCL 10 MG PO TABS   Oral   Take 10 mg by mouth 3 (three) times daily as needed.         . IBUPROFEN 200 MG PO TABS   Oral   Take 800 mg by mouth 3 (three) times daily as needed. For pain         . NAPROXEN SODIUM 220 MG PO TABS   Oral   Take 220 mg by mouth 2 (two) times daily with a meal.         . OXYCODONE-ACETAMINOPHEN 5-325 MG PO TABS   Oral   Take 1-2 tablets by mouth every 6  (six) hours as needed for pain.   15 tablet   0   . OXYCODONE-ACETAMINOPHEN 5-325 MG PO TABS   Oral   Take 1-2 tablets by mouth every 6 (six) hours as needed for pain.   20 tablet   0     BP 145/93  Pulse 86  Temp 98.4 F (36.9 C) (Oral)  Resp 18  Ht 6\' 3"  (1.905 m)  Wt 245 lb (111.131 kg)  BMI 30.62 kg/m2  SpO2 100%  Physical Exam  Nursing note and vitals reviewed. Constitutional: He appears well-developed and well-nourished.  HENT:  Head: Normocephalic and atraumatic.  Mouth/Throat: Oropharynx is clear and moist.       Swollen lower back gum,  Decayed tooth  Eyes: Conjunctivae normal and EOM are normal. Pupils are equal, round, and reactive to light.  Neck: Neck supple.  Cardiovascular: Normal rate and normal heart sounds.   Pulmonary/Chest: Effort normal and breath sounds normal.  Abdominal: Soft. Bowel sounds are normal.  Musculoskeletal: Normal range of motion.  Neurological: He is alert.  Skin: Skin is warm.    ED Course  Procedures (including  critical care time)  Labs Reviewed - No data to display No results found.   1. Toothache       MDM  Clindamycin and hydrocodone        Lonia Skinner [place], [place] 07/18/12 1314

## 2012-08-25 ENCOUNTER — Encounter (HOSPITAL_BASED_OUTPATIENT_CLINIC_OR_DEPARTMENT_OTHER): Payer: Self-pay | Admitting: *Deleted

## 2012-08-25 ENCOUNTER — Emergency Department (HOSPITAL_BASED_OUTPATIENT_CLINIC_OR_DEPARTMENT_OTHER)
Admission: EM | Admit: 2012-08-25 | Discharge: 2012-08-25 | Disposition: A | Discharge status: Home / Self Care | Payer: Self-pay | Attending: Emergency Medicine | Admitting: Emergency Medicine

## 2012-08-25 ENCOUNTER — Emergency Department (HOSPITAL_BASED_OUTPATIENT_CLINIC_OR_DEPARTMENT_OTHER): Payer: Self-pay

## 2012-08-25 DIAGNOSIS — M25559 Pain in unspecified hip: Secondary | ICD-10-CM | POA: Insufficient documentation

## 2012-08-25 DIAGNOSIS — F17200 Nicotine dependence, unspecified, uncomplicated: Secondary | ICD-10-CM | POA: Insufficient documentation

## 2012-08-25 DIAGNOSIS — Z8679 Personal history of other diseases of the circulatory system: Secondary | ICD-10-CM | POA: Insufficient documentation

## 2012-08-25 DIAGNOSIS — M87051 Idiopathic aseptic necrosis of right femur: Secondary | ICD-10-CM

## 2012-08-25 DIAGNOSIS — M87052 Idiopathic aseptic necrosis of left femur: Secondary | ICD-10-CM

## 2012-08-25 DIAGNOSIS — M87059 Idiopathic aseptic necrosis of unspecified femur: Secondary | ICD-10-CM | POA: Insufficient documentation

## 2012-08-25 HISTORY — DX: Idiopathic aseptic necrosis of unspecified bone: M87.00

## 2012-08-25 MED ORDER — OXYCODONE-ACETAMINOPHEN 5-325 MG PO TABS
1.0000 | ORAL_TABLET | ORAL | Status: DC | PRN
Start: 2012-08-25 — End: 2013-08-17

## 2012-08-25 NOTE — ED Provider Notes (Signed)
History     CSN: [id number]  Arrival date & time 08/25/12  1152   First MD Initiated Contact with Patient 08/25/12 1202      Chief Complaint  Patient presents with  . Leg Pain    (Consider location/radiation/quality/duration/timing/severity/associated sxs/prior treatment) HPI Pt p/w 2 years of bl hip pain. 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 Pt states the pain occurs when he has been sitting for a while and then tries to move. Once warmed up, pain remits. No lower leg pain, weakness or numbness. Pain has been getting worse over the past few days while at work. No pain currently. Took a narcotic with some relief 2 days ago.  Past Medical History  Diagnosis Date  . 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 Avascular necrosis     Past Surgical History  Procedure Date  . Hernia repair     No family history on file.  History  Substance Use Topics  . Smoking status: Current Every Day Smoker  . Smokeless tobacco: Not on file  . Alcohol Use: Yes      Review of Systems  Constitutional: Negative for fever and chills.  Respiratory: Negative for shortness of breath.   Cardiovascular: Negative for chest pain.  Gastrointestinal: Negative for nausea, vomiting, abdominal pain and diarrhea.  Musculoskeletal: Positive for arthralgias. Negative for back pain.  Skin: Negative for rash and wound.  Neurological: Negative for dizziness, weakness, light-headedness, numbness and headaches.  All other systems reviewed and are negative.    Allergies  Amoxicillin; Penicillins; Bactrim; and Sulfa antibiotics  Home Medications   Current Outpatient Rx  Name  Route  Sig  Dispense  Refill  . ACETAMINOPHEN 500 MG PO TABS   Oral   Take 1,000 mg by mouth every 6 (six) hours as needed. For pain         . CLINDAMYCIN HCL 150 MG PO CAPS   Oral   Take 1 capsule (150 mg total) by  mouth every 6 (six) hours.   28 capsule   0   . CYCLOBENZAPRINE HCL 10 MG PO TABS   Oral   Take 10 mg by mouth 3 (three) times daily as needed.         . IBUPROFEN 200 MG PO TABS   Oral   Take 800 mg by mouth 3 (three) times daily as needed. For pain         . NAPROXEN SODIUM 220 MG PO TABS   Oral   Take 220 mg by mouth 2 (two) times daily with a meal.         . OXYCODONE-ACETAMINOPHEN 5-325 MG PO TABS   Oral   Take 1-2 tablets by mouth every 6 (six) hours as needed for pain.   15 tablet   0   . OXYCODONE-ACETAMINOPHEN 5-325 MG PO TABS   Oral   Take 1-2 tablets by mouth every 6 (six) hours as needed for pain.   20 tablet   0   . OXYCODONE-ACETAMINOPHEN 5-325 MG PO TABS   Oral   Take 1 tablet by mouth every 4 (four) hours as needed for pain.   15 tablet   0     BP 127/93  Pulse 96  Temp 98.4 F (36.9 C) (Oral)  Resp 18  SpO2 100%  Physical Exam  Nursing note and vitals reviewed. Constitutional: He is oriented to person,  place, and time. He appears well-developed and well-nourished. No distress.  HENT:  Head: Normocephalic and atraumatic.  Mouth/Throat: Oropharynx is clear and moist.  Eyes: EOM are normal. Pupils are equal, round, and reactive to light.  Neck: Normal range of motion. Neck supple.  Cardiovascular: Normal rate and regular rhythm.   Pulmonary/Chest: Effort normal and breath sounds normal. No respiratory distress. He has no wheezes. He has no rales.  Abdominal: Soft. Bowel sounds are normal. He exhibits no mass. There is no tenderness. There is no rebound and no guarding.  Musculoskeletal: Normal range of motion. He exhibits no edema and no tenderness.       FROM at hips without pain. No muscular TTP, No calf tenderness or swelling. 2+ DP and PT pulses bl.   Neurological: He is alert and oriented to person, place, and time.       5/5 motor in all ext, sensation intact  Skin: Skin is warm and dry. No rash noted. No erythema.  Psychiatric: He  has a normal mood and affect. His behavior is normal.    ED Course  Procedures (including critical care time)  Labs Reviewed - No data to display Dg Hip Bilateral W/pelvis  08/25/2012  *RADIOLOGY REPORT*  Clinical Data: Bilateral leg pain.  Multiple recent falls.  BILATERAL HIP WITH PELVIS - 4+ VIEW  Comparison: Abdominal pelvic CT 12/05/2005.  Pelvic and right hip radiographs 10/26/2010.  Findings: There is progressive sclerosis in both femoral heads and necks.  On the right side, this is considerably worse and associated with the new fragmentation and subchondral collapse of the femoral head.  There is no subchondral collapse on the left. There are no significant secondary degenerative changes.  The pelvis appears unremarkable.  IMPRESSION: Progressive bilateral femoral head avascular necrosis with new subchondral collapse on the right.   Original Report Authenticated By: Carey Bullocks, M.D.      1. Avascular necrosis of femur head, left   2. Avascular necrosis of femur head, right       MDM  Discussed with Dr Turner Daniels. Can see in office today or next week. Pt states he will call and schedule apt for next week.         Loren Racer, MD 08/25/12 1536

## 2012-08-25 NOTE — ED Notes (Signed)
Here for complaint of leg pain. States he was evaluated last year for same by a neurologist in [place]. His diagnosis was avascular Necrosis. Has had a flare up of symptoms with pain, leg weakness and falling for the past 2 days. Ambulatory from triage to treatment room without difficulty.

## 2012-08-25 NOTE — ED Notes (Signed)
Encouraged Pt. That the EDP will be in to see the Pt.

## 2012-09-09 ENCOUNTER — Emergency Department (HOSPITAL_BASED_OUTPATIENT_CLINIC_OR_DEPARTMENT_OTHER)
Admission: EM | Admit: 2012-09-09 | Discharge: 2012-09-09 | Disposition: A | Discharge status: Home / Self Care | Payer: Worker's Compensation | Attending: Emergency Medicine | Admitting: Emergency Medicine

## 2012-09-09 ENCOUNTER — Encounter (HOSPITAL_BASED_OUTPATIENT_CLINIC_OR_DEPARTMENT_OTHER): Payer: Self-pay | Admitting: *Deleted

## 2012-09-09 DIAGNOSIS — G8929 Other chronic pain: Secondary | ICD-10-CM

## 2012-09-09 DIAGNOSIS — F17200 Nicotine dependence, unspecified, uncomplicated: Secondary | ICD-10-CM | POA: Insufficient documentation

## 2012-09-09 DIAGNOSIS — M25559 Pain in unspecified hip: Secondary | ICD-10-CM | POA: Insufficient documentation

## 2012-09-09 DIAGNOSIS — M8700 Idiopathic aseptic necrosis of unspecified bone: Secondary | ICD-10-CM

## 2012-09-09 DIAGNOSIS — Z87828 Personal history of other (healed) physical injury and trauma: Secondary | ICD-10-CM | POA: Insufficient documentation

## 2012-09-09 DIAGNOSIS — R269 Unspecified abnormalities of gait and mobility: Secondary | ICD-10-CM | POA: Insufficient documentation

## 2012-09-09 DIAGNOSIS — Z79899 Other long term (current) drug therapy: Secondary | ICD-10-CM | POA: Insufficient documentation

## 2012-09-09 MED ORDER — OXYCODONE-ACETAMINOPHEN 5-325 MG PO TABS
1.0000 | ORAL_TABLET | Freq: Once | ORAL | Status: AC
  Administered 2012-09-09: 1 via ORAL
  Filled 2012-09-09 (×2): qty 1

## 2012-09-09 NOTE — ED Notes (Addendum)
Was seen for hip pain here and has been seen by orthopedic dr. Patient states that he was given tramadol for pain., but that doesn't help. States percocet helps but "the dr took him off of that".

## 2012-09-09 NOTE — ED Provider Notes (Signed)
I have personally seen and examined the patient.  I have discussed the plan of care with the resident.  I have reviewed the documentation on PMH/FH/Soc. History.  I have reviewed the documentation of the resident and agree.  Pt in no distress, denies new injury.  Advised need for f/u with pain management.  Pt agreeable  Joya Gaskins, MD 09/09/12 1520

## 2012-09-09 NOTE — ED Provider Notes (Signed)
History     CSN: [id number]  Arrival date & time 09/09/12  1140   First MD Initiated Contact with Patient 09/09/12 1248      Chief Complaint  Patient presents with  . Hip Pain    (Consider location/radiation/quality/duration/timing/severity/associated sxs/prior treatment) HPI Comments: 37 y.o PMH AVN (b/l hips) noted 08/25/12 on imaging with b/l femur head AVN with new subchondral collapse, right.  She has seen [facility] (Dr. Adline Peals) in the past, [facility] orthopedics as well.  He recently went to [facility] who gave him Tramadol without relief but he does get relief with narcotics which they did not Rx him.  He complains right hip>left hip pain 10/10 worse with mobility and standing causing him to limp.  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  Etiology of AVN may have been previous anabolic steroid use 6 years ago x 6 months.    [facility]: hernia repair SH: smoker, denies drugs, alcohol     Patient is a 37 y.o. male presenting with hip pain. The history is provided by the patient. No language interpreter was used.  Hip Pain This is a chronic problem. Pertinent negatives include no chest pain. The symptoms are aggravated by standing and walking. He has tried NSAIDs and oral narcotics for the symptoms. Improvement on treatment: improvement with narcotics not nsaids or tramadol.    Past Medical History  Diagnosis Date  . 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 Avascular necrosis     Past Surgical History  Procedure Laterality Date  . Hernia repair      No family history on file.  History  Substance Use Topics  . Smoking status: Current Every Day Smoker  . Smokeless tobacco: Not on file  . Alcohol Use: Yes      Review of Systems  Respiratory: Negative for shortness of breath.   Cardiovascular: Negative for chest pain.  Gastrointestinal: Negative for  constipation.  Musculoskeletal: Positive for gait problem.       B/l hip pain right >left   All other systems reviewed and are negative.    Allergies  Amoxicillin; Penicillins; Bactrim; and Sulfa antibiotics  Home Medications   Current Outpatient Rx  Name  Route  Sig  Dispense  Refill  . acetaminophen (TYLENOL) 500 MG tablet   Oral   Take 1,000 mg by mouth every 6 (six) hours as needed. For pain         . clindamycin (CLEOCIN) 150 MG capsule   Oral   Take 1 capsule (150 mg total) by mouth every 6 (six) hours.   28 capsule   0   . cyclobenzaprine (FLEXERIL) 10 MG tablet   Oral   Take 10 mg by mouth 3 (three) times daily as needed.         Marland Kitchen ibuprofen (ADVIL,MOTRIN) 200 MG tablet   Oral   Take 800 mg by mouth 3 (three) times daily as needed. For pain         . naproxen sodium (ANAPROX) 220 MG tablet   Oral   Take 220 mg by mouth 2 (two) times daily with a meal.         . oxyCODONE-acetaminophen (PERCOCET/ROXICET) 5-325 MG per tablet   Oral   Take 1-2 tablets by mouth every 6 (six) hours as needed for pain.   15 tablet   0   . oxyCODONE-acetaminophen (PERCOCET/ROXICET) 5-325  MG per tablet   Oral   Take 1-2 tablets by mouth every 6 (six) hours as needed for pain.   20 tablet   0   . oxyCODONE-acetaminophen (PERCOCET/ROXICET) 5-325 MG per tablet   Oral   Take 1 tablet by mouth every 4 (four) hours as needed for pain.   15 tablet   0     BP 112/76  Pulse 84  Temp(Src) 98.1 F (36.7 C) (Oral)  SpO2 98%  Physical Exam  Nursing note and vitals reviewed. Constitutional: He is oriented to person, place, and time. Vital signs are normal. He appears well-developed and well-nourished. He is cooperative. No distress.  HENT:  Head: Normocephalic and atraumatic.  Mouth/Throat: Oropharynx is clear and moist and mucous membranes are normal. Abnormal dentition. No oropharyngeal exudate.  Eyes: Conjunctivae are normal. Pupils are equal, round, and reactive to  light. Right eye exhibits no discharge. Left eye exhibits no discharge. No scleral icterus.  Cardiovascular: Normal rate, regular rhythm, S1 normal and S2 normal.   No murmur heard. Pulmonary/Chest: Effort normal and breath sounds normal. No respiratory distress. He has no wheezes.  Abdominal: Soft. Bowel sounds are normal. He exhibits no distension. There is no tenderness.  Musculoskeletal: He exhibits tenderness.       Right hip: He exhibits tenderness.  ROM intact b/l hips  Pain with ROM b/l hips right>left   Neurological: He is alert and oriented to person, place, and time.  Skin: Skin is warm, dry and intact. No rash noted. He is not diaphoretic.  Psychiatric: He has a normal mood and affect. His speech is normal and behavior is normal. Judgment and thought content normal. Cognition and memory are normal.    ED Course  Procedures (including critical care time)  Labs Reviewed - No data to display No results found.   1. Chronic right hip pain   2. AVN (avascular necrosis of bone)       MDM  Referred to pain clinic and needs to f/u with orthopedics for chronic pain control  Shirlee Latch MD [phone]         Annett Gula, MD 09/09/12 1319  Annett Gula, MD 09/09/12 1319  Annett Gula, MD 09/09/12 1323

## 2012-11-03 ENCOUNTER — Emergency Department (HOSPITAL_BASED_OUTPATIENT_CLINIC_OR_DEPARTMENT_OTHER)
Admission: EM | Admit: 2012-11-03 | Discharge: 2012-11-03 | Disposition: A | Discharge status: Home / Self Care | Payer: Worker's Compensation | Attending: Emergency Medicine | Admitting: Emergency Medicine

## 2012-11-03 ENCOUNTER — Encounter (HOSPITAL_BASED_OUTPATIENT_CLINIC_OR_DEPARTMENT_OTHER): Payer: Self-pay | Admitting: *Deleted

## 2012-11-03 ENCOUNTER — Emergency Department (HOSPITAL_BASED_OUTPATIENT_CLINIC_OR_DEPARTMENT_OTHER): Payer: Worker's Compensation

## 2012-11-03 DIAGNOSIS — F17200 Nicotine dependence, unspecified, uncomplicated: Secondary | ICD-10-CM | POA: Insufficient documentation

## 2012-11-03 DIAGNOSIS — M87059 Idiopathic aseptic necrosis of unspecified femur: Secondary | ICD-10-CM | POA: Insufficient documentation

## 2012-11-03 DIAGNOSIS — Z8679 Personal history of other diseases of the circulatory system: Secondary | ICD-10-CM | POA: Insufficient documentation

## 2012-11-03 DIAGNOSIS — R269 Unspecified abnormalities of gait and mobility: Secondary | ICD-10-CM | POA: Insufficient documentation

## 2012-11-03 DIAGNOSIS — S72002S Fracture of unspecified part of neck of left femur, sequela: Secondary | ICD-10-CM

## 2012-11-03 DIAGNOSIS — M87052 Idiopathic aseptic necrosis of left femur: Secondary | ICD-10-CM

## 2012-11-03 DIAGNOSIS — Z79899 Other long term (current) drug therapy: Secondary | ICD-10-CM | POA: Insufficient documentation

## 2012-11-03 DIAGNOSIS — R296 Repeated falls: Secondary | ICD-10-CM | POA: Insufficient documentation

## 2012-11-03 DIAGNOSIS — S72009A Fracture of unspecified part of neck of unspecified femur, initial encounter for closed fracture: Secondary | ICD-10-CM | POA: Insufficient documentation

## 2012-11-03 DIAGNOSIS — Y929 Unspecified place or not applicable: Secondary | ICD-10-CM | POA: Insufficient documentation

## 2012-11-03 DIAGNOSIS — Y939 Activity, unspecified: Secondary | ICD-10-CM | POA: Insufficient documentation

## 2012-11-03 MED ORDER — IBUPROFEN 200 MG PO TABS
600.0000 mg | ORAL_TABLET | Freq: Once | ORAL | Status: AC
  Administered 2012-11-03: 600 mg via ORAL
  Filled 2012-11-03: qty 1

## 2012-11-03 MED ORDER — OXYCODONE-ACETAMINOPHEN 5-325 MG PO TABS: 1.0000 | ORAL_TABLET | ORAL | Status: DC | PRN

## 2012-11-03 MED ORDER — IBUPROFEN 600 MG PO TABS: 600.0000 mg | ORAL_TABLET | Freq: Four times a day (QID) | ORAL | Status: DC | PRN
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
## 2012-11-03 NOTE — ED Notes (Signed)
Second call to on call orthro -- via [facility]
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
## 2013-08-17 ENCOUNTER — Encounter (HOSPITAL_BASED_OUTPATIENT_CLINIC_OR_DEPARTMENT_OTHER): Payer: Self-pay | Admitting: Emergency Medicine

## 2013-08-17 ENCOUNTER — Emergency Department (HOSPITAL_BASED_OUTPATIENT_CLINIC_OR_DEPARTMENT_OTHER)
Admission: EM | Admit: 2013-08-17 | Discharge: 2013-08-17 | Disposition: A | Discharge status: Home / Self Care | Payer: Self-pay | Attending: Emergency Medicine | Admitting: Emergency Medicine

## 2013-08-17 DIAGNOSIS — F17200 Nicotine dependence, unspecified, uncomplicated: Secondary | ICD-10-CM | POA: Insufficient documentation

## 2013-08-17 DIAGNOSIS — M25552 Pain in left hip: Secondary | ICD-10-CM

## 2013-08-17 DIAGNOSIS — Z880 Allergy status to penicillin: Secondary | ICD-10-CM | POA: Insufficient documentation

## 2013-08-17 DIAGNOSIS — G8929 Other chronic pain: Secondary | ICD-10-CM | POA: Insufficient documentation

## 2013-08-17 DIAGNOSIS — M25559 Pain in unspecified hip: Secondary | ICD-10-CM | POA: Insufficient documentation

## 2013-08-17 MED ORDER — HYDROCODONE-ACETAMINOPHEN 5-325 MG PO TABS: 1.0000 | ORAL_TABLET | ORAL | Status: DC | PRN

## 2013-08-17 MED ORDER — IBUPROFEN 800 MG PO TABS: 800.0000 mg | ORAL_TABLET | Freq: Three times a day (TID) | ORAL | Status: DC

## 2013-08-17 NOTE — ED Provider Notes (Signed)
CSN: [id number]     Arrival date & time 08/17/13  1623 History   First MD Initiated Contact with Patient 08/17/13 1635     Chief Complaint  Patient presents with  . Leg Pain   (Consider location/radiation/quality/duration/timing/severity/associated sxs/prior Treatment) Patient is a 38 y.o. male presenting with leg pain. The history is provided by the patient. No language interpreter was used.  Leg Pain Location:  Hip Injury: no   Hip location:  L hip Associated symptoms: no fever   Associated symptoms comment:  Patient with a history of avascular necrosis thought secondary to remote anabolic steroid use presents with recurrent left hip pain. No new injury. No swelling.   Past Medical History  Diagnosis Date  . 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 Avascular necrosis    Past Surgical History  Procedure Laterality Date  . Hernia repair     History reviewed. No pertinent family history. History  Substance Use Topics  . Smoking status: Current Every Day Smoker  . Smokeless tobacco: Not on file  . Alcohol Use: Yes    Review of Systems  Constitutional: Negative for fever and chills.  Respiratory: Negative.  Negative for shortness of breath.   Cardiovascular: Negative.  Negative for chest pain.  Gastrointestinal: Negative.   Musculoskeletal:       See HPI  Skin: Negative.   Neurological: Negative.  Negative for numbness.    Allergies  Amoxicillin; Penicillins; Bactrim; and Sulfa antibiotics  Home Medications  No current outpatient prescriptions on file. BP 130/78  Pulse 76  Temp(Src) 97.6 F (36.4 C) (Oral)  Resp 18  Ht 6\' 3"  (1.905 m)  Wt 210 lb (95.255 kg)  BMI 26.25 kg/m2  SpO2 100% Physical Exam  Constitutional: He is oriented to person, place, and time. He appears well-developed and well-nourished.  Neck: Normal range of motion.  Pulmonary/Chest: Effort normal.  Abdominal: There is no tenderness.  Musculoskeletal: Normal range  of motion.  Tender left hip to anterior aspect. No swelling. No muscular thigh or calf tenderness. FROM of all joints of the lower extremity.   Neurological: He is alert and oriented to person, place, and time.  Skin: Skin is warm and dry.  Psychiatric: He has a normal mood and affect.    ED Course  Procedures (including critical care time) Labs Review Labs Reviewed - No data to display Imaging Review No results found.  EKG Interpretation   None       MDM  No diagnosis found. 1. Recurrent left hip pain  No further imaging required today. Discussed chronic nature of pain and importance of PCP follow up for pain management.  Controlled Data base consulted showing narcotic Rx x 1 in past 6 months.     Arnoldo HookerShari A Tully Mcinturff, PA-C 08/17/13 1701

## 2013-08-17 NOTE — ED Provider Notes (Signed)
Medical screening examination/treatment/procedure(s) were performed by non-physician practitioner and as supervising physician I was immediately available for consultation/collaboration.  EKG Interpretation   None         Christopher J. Pollina, MD 08/17/13 2323 

## 2013-08-17 NOTE — ED Notes (Signed)
pa at bedside. 

## 2013-08-17 NOTE — Discharge Instructions (Signed)
Arthralgia  Your caregiver has diagnosed you as suffering from an arthralgia. Arthralgia means there is pain in a joint. This can come from many reasons including:  · Bruising the joint which causes soreness (inflammation) in the joint.  · Wear and tear on the joints which occur as we grow older (osteoarthritis).  · Overusing the joint.  · Various forms of arthritis.  · Infections of the joint.  Regardless of the cause of pain in your joint, most of these different pains respond to anti-inflammatory drugs and rest. The exception to this is when a joint is infected, and these cases are treated with antibiotics, if it is a bacterial infection.  HOME CARE INSTRUCTIONS   · Rest the injured area for as long as directed by your caregiver. Then slowly start using the joint as directed by your caregiver and as the pain allows. Crutches as directed may be useful if the ankles, knees or hips are involved. If the knee was splinted or casted, continue use and care as directed. If an stretchy or elastic wrapping bandage has been applied today, it should be removed and re-applied every 3 to 4 hours. It should not be applied tightly, but firmly enough to keep swelling down. Watch toes and feet for swelling, bluish discoloration, coldness, numbness or excessive pain. If any of these problems (symptoms) occur, remove the ace bandage and re-apply more loosely. If these symptoms persist, contact your caregiver or return to this location.  · For the first 24 hours, keep the injured extremity elevated on pillows while lying down.  · Apply ice for 15-20 minutes to the sore joint every couple hours while awake for the first half day. Then 03-04 times per day for the first 48 hours. Put the ice in a plastic bag and place a towel between the bag of ice and your skin.  · Wear any splinting, casting, elastic bandage applications, or slings as instructed.  · Only take over-the-counter or prescription medicines for pain, discomfort, or fever as  directed by your caregiver. Do not use aspirin immediately after the injury unless instructed by your physician. Aspirin can cause increased bleeding and bruising of the tissues.  · If you were given crutches, continue to use them as instructed and do not resume weight bearing on the sore joint until instructed.  Persistent pain and inability to use the sore joint as directed for more than 2 to 3 days are warning signs indicating that you should see a caregiver for a follow-up visit as soon as possible. Initially, a hairline fracture (break in bone) may not be evident on X-rays. Persistent pain and swelling indicate that further evaluation, non-weight bearing or use of the joint (use of crutches or slings as instructed), or further X-rays are indicated. X-rays may sometimes not show a small fracture until a week or 10 days later. Make a follow-up appointment with your own caregiver or one to whom we have referred you. A radiologist (specialist in reading X-rays) may read your X-rays. Make sure you know how you are to obtain your X-ray results. Do not assume everything is normal if you do not hear from us.  SEEK MEDICAL CARE IF:  Bruising, swelling, or pain increases.  SEEK IMMEDIATE MEDICAL CARE IF:   · Your fingers or toes are numb or blue.  · The pain is not responding to medications and continues to stay the same or get worse.  · The pain in your joint becomes severe.  · You   develop a fever over 102° F (38.9° C).  · It becomes impossible to move or use the joint.  MAKE SURE YOU:   · Understand these instructions.  · Will watch your condition.  · Will get help right away if you are not doing well or get worse.  Document Released: 07/12/2005 Document Revised: 10/04/2011 Document Reviewed: 02/28/2008  [employer]® Patient Information ©2014 [employer].

## 2013-08-17 NOTE — ED Notes (Signed)
Pt reports left leg pain since waking up at 12pm this morning.  Pt has hx of vascular necrosis and reports that it feels the same.  Pt reports that his leg is weak and numb but states that he was able to drive up here.

## 2015-10-01 ENCOUNTER — Emergency Department (HOSPITAL_BASED_OUTPATIENT_CLINIC_OR_DEPARTMENT_OTHER): Payer: Medicaid Other

## 2015-10-01 ENCOUNTER — Encounter (HOSPITAL_BASED_OUTPATIENT_CLINIC_OR_DEPARTMENT_OTHER): Payer: Self-pay | Admitting: Emergency Medicine

## 2015-10-01 ENCOUNTER — Emergency Department (HOSPITAL_BASED_OUTPATIENT_CLINIC_OR_DEPARTMENT_OTHER)
Admission: EM | Admit: 2015-10-01 | Discharge: 2015-10-01 | Disposition: A | Discharge status: Home / Self Care | Payer: Medicaid Other | Attending: Emergency Medicine | Admitting: Emergency Medicine

## 2015-10-01 DIAGNOSIS — Z872 Personal history of diseases of the skin and subcutaneous tissue: Secondary | ICD-10-CM | POA: Diagnosis not present

## 2015-10-01 DIAGNOSIS — M25561 Pain in right knee: Secondary | ICD-10-CM

## 2015-10-01 DIAGNOSIS — Y9289 Other specified places as the place of occurrence of the external cause: Secondary | ICD-10-CM | POA: Insufficient documentation

## 2015-10-01 DIAGNOSIS — M25552 Pain in left hip: Secondary | ICD-10-CM

## 2015-10-01 DIAGNOSIS — S8991XA Unspecified injury of right lower leg, initial encounter: Secondary | ICD-10-CM | POA: Insufficient documentation

## 2015-10-01 DIAGNOSIS — S79912A Unspecified injury of left hip, initial encounter: Secondary | ICD-10-CM | POA: Diagnosis present

## 2015-10-01 DIAGNOSIS — F17200 Nicotine dependence, unspecified, uncomplicated: Secondary | ICD-10-CM | POA: Insufficient documentation

## 2015-10-01 DIAGNOSIS — Z791 Long term (current) use of non-steroidal anti-inflammatories (NSAID): Secondary | ICD-10-CM | POA: Insufficient documentation

## 2015-10-01 DIAGNOSIS — X58XXXA Exposure to other specified factors, initial encounter: Secondary | ICD-10-CM | POA: Insufficient documentation

## 2015-10-01 DIAGNOSIS — Y9389 Activity, other specified: Secondary | ICD-10-CM | POA: Insufficient documentation

## 2015-10-01 DIAGNOSIS — Y998 Other external cause status: Secondary | ICD-10-CM | POA: Insufficient documentation

## 2015-10-01 DIAGNOSIS — Z8739 Personal history of other diseases of the musculoskeletal system and connective tissue: Secondary | ICD-10-CM | POA: Diagnosis not present

## 2015-10-01 DIAGNOSIS — Z880 Allergy status to penicillin: Secondary | ICD-10-CM | POA: Insufficient documentation

## 2015-10-01 DIAGNOSIS — R52 Pain, unspecified: Secondary | ICD-10-CM

## 2015-10-01 DIAGNOSIS — G8929 Other chronic pain: Secondary | ICD-10-CM

## 2015-10-01 MED ORDER — KETOROLAC TROMETHAMINE 60 MG/2ML IM SOLN: 30.0000 mg | Freq: Once | INTRAMUSCULAR | Status: DC

## 2015-10-01 MED ORDER — KETOROLAC TROMETHAMINE 30 MG/ML IJ SOLN
INTRAMUSCULAR | Status: AC
  Administered 2015-10-01: 30 mg
  Filled 2015-10-01: qty 1

## 2015-10-01 MED ORDER — NAPROXEN 500 MG PO TABS: 500.0000 mg | ORAL_TABLET | Freq: Two times a day (BID) | ORAL | Status: DC

## 2015-10-01 NOTE — ED Notes (Signed)
PA notified of pts potential "allergic" reaction to Toradol.  Pt sts it was feeling lightheaded.  PA gave pt the option of receiving the inj and we will monitor for 30 min to be sure there is no reaction.  Pt is agreeable with that plan.

## 2015-10-01 NOTE — ED Provider Notes (Signed)
CSN: [id number]     Arrival date & time 10/01/15  1430 History   First MD Initiated Contact with Patient 10/01/15 1640     Chief Complaint  Patient presents with  . Hip Pain     (Consider location/radiation/quality/duration/timing/severity/associated sxs/prior Treatment) HPI   40 year old male with history of avascular necrosis of the hips who presents complaining of left hip pain. Patient report yesterday morning at out of bed and felt a pop to his left hip follows with sharp throbbing nonradiating pain. Pain has been persistent and not adequately improved with taking ibuprofen that he had at home. Patient felt his avascular necrosis is affecting his right hip more than his left hip but he has been using his left leg more to compensate and he felt that it may have worsening his left hip pain. Furthermore he also complaining of increased worsening right knee pain for the past several weeks without any specific injury. Patient denies any new numbness, rash, fever or chills. He does not have a primary care provider due to lack of insurance. Patient walks with a cane and states that he is able to ambulate with some difficulty.    Past Medical History  Diagnosis Date  . 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 Avascular necrosis [facility](HCC)    Past Surgical History  Procedure Laterality Date  . Hernia repair     No family history on file. Social History  Substance Use Topics  . Smoking status: Light Tobacco Smoker  . Smokeless tobacco: None  . Alcohol Use: Yes    Review of Systems  Constitutional: Negative for fever.  Musculoskeletal: Positive for arthralgias.  Neurological: Negative for numbness.      Allergies  Amoxicillin; Penicillins; Bactrim; and Sulfa antibiotics  Home Medications   Prior to Admission medications   Medication Sig Start Date End Date Taking? Authorizing Provider  ibuprofen (ADVIL,MOTRIN) 800 MG tablet Take 1 tablet (800 mg  total) by mouth 3 (three) times daily. 08/17/13  Yes Elpidio AnisShari Upstill, PA-C  HYDROcodone-acetaminophen (NORCO/VICODIN) 5-325 MG per tablet Take 1-2 tablets by mouth every 4 (four) hours as needed. 08/17/13   Shari Upstill, PA-C   BP 134/91 mmHg  Pulse 72  Temp(Src) 98.5 F (36.9 C) (Oral)  Resp 17  Ht 6\' 3"  (1.905 m)  Wt 95.255 kg  BMI 26.25 kg/m2  SpO2 100% Physical Exam  Constitutional: He appears well-developed and well-nourished. No distress.  African-American male sitting in the chair in no acute discomfort.  HENT:  Head: Atraumatic.  Eyes: Conjunctivae are normal.  Neck: Neck supple.  Cardiovascular: Intact distal pulses.   Abdominal: Soft. There is no tenderness.  Musculoskeletal: He exhibits tenderness (Left hip: Tenderness noted to lateral hip on palpation with decreased hip flexion extension abduction and abduction but no gross deformity and no overlying skin changes. Right knee: Tenderness to medial and lateral joint line with normal knee flexion/exten).  No significant midline spine tenderness crepitus or step-off.  Neurological: He is alert.  Skin: No rash noted.  Psychiatric: He has a normal mood and affect.  Nursing note and vitals reviewed.   ED Course  Procedures (including critical care time) Labs Review Labs Reviewed - No data to display  Imaging Review Dg Knee Complete 4 Views Right  10/01/2015  CLINICAL DATA:  Knee pain for 1 month, no known injury, initial encounter EXAM: RIGHT KNEE - COMPLETE 4+ VIEW COMPARISON:  None. FINDINGS: Mild degenerative changes are noted in the patellofemoral joint  space. No joint effusion is seen. No acute fracture or dislocation is noted. Some increased sclerosis is noted in the left femoral condyle. IMPRESSION: Degenerative change without acute abnormality. Electronically Signed   By: Alcide Clever M.D.   On: 10/01/2015 15:31   Dg Hip Unilat With Pelvis 2-3 Views Left  10/01/2015  CLINICAL DATA:  Left hip popped while getting out of  bed with persistent pain, initial encounter EXAM: DG HIP (WITH OR WITHOUT PELVIS) 2-3V LEFT COMPARISON:  11/03/2012 FINDINGS: Degenerative changes are noted in both hip joints right greater than left. There is remodeling of the right femoral head and increased sclerosis consistent with the patient's given clinical history of AVN. Similar changes with lesser remodeling are seen within the left hip and these have progressed significantly in the interval from the prior exam. IMPRESSION: Changes consistent with avascular necrosis in both hips with more progressive changes on the right than the left. Electronically Signed   By: Alcide Clever M.D.   On: 10/01/2015 15:34   I have personally reviewed and evaluated these images and lab results as part of my medical decision-making.   EKG Interpretation None      MDM   Final diagnoses:  Chronic left hip pain  Chronic knee pain, right    BP 136/79 mmHg  Pulse 84  Temp(Src) 98.5 F (36.9 C) (Oral)  Resp 16  Ht  (1.905 m)  Wt 95.255 kg  BMI 26.25 kg/m2  SpO2 100%   4:53 PM patient here with acute on chronic right knee and left hip pain. X-rays shows no acute finding concerning for fractures or dislocation. No signs to suggest septic joint. He is neurovascularly intact. I will provide non-narcotic pain medication and will also give referral for outpatient follow-up.  5:47 PM Pt tolerates Toradol shot without side effect after being monitored for 40 min.  Pt stable for discharge.    Fayrene Helper, PA-C 10/01/15 1748  Richardean Canal, MD 10/01/15 [phone]

## 2015-10-01 NOTE — ED Notes (Signed)
Pt c/o left hip pain upon awaking this morning, denies trauma or injury. States when he got up he felt a pop. Ambulatory with cane at triage.

## 2015-10-01 NOTE — Discharge Instructions (Signed)

## 2016-04-22 ENCOUNTER — Emergency Department (HOSPITAL_COMMUNITY): Payer: Medicaid Other

## 2016-04-22 ENCOUNTER — Emergency Department (HOSPITAL_COMMUNITY)
Admission: EM | Admit: 2016-04-22 | Discharge: 2016-04-22 | Disposition: A | Discharge status: Home / Self Care | Payer: Medicaid Other | Attending: Emergency Medicine | Admitting: Emergency Medicine

## 2016-04-22 ENCOUNTER — Encounter (HOSPITAL_COMMUNITY): Payer: Self-pay | Admitting: Emergency Medicine

## 2016-04-22 DIAGNOSIS — X58XXXA Exposure to other specified factors, initial encounter: Secondary | ICD-10-CM | POA: Insufficient documentation

## 2016-04-22 DIAGNOSIS — S79912A Unspecified injury of left hip, initial encounter: Secondary | ICD-10-CM | POA: Diagnosis present

## 2016-04-22 DIAGNOSIS — Y939 Activity, unspecified: Secondary | ICD-10-CM | POA: Diagnosis not present

## 2016-04-22 DIAGNOSIS — F17200 Nicotine dependence, unspecified, uncomplicated: Secondary | ICD-10-CM | POA: Diagnosis not present

## 2016-04-22 DIAGNOSIS — G8929 Other chronic pain: Secondary | ICD-10-CM

## 2016-04-22 DIAGNOSIS — Z7982 Long term (current) use of aspirin: Secondary | ICD-10-CM | POA: Insufficient documentation

## 2016-04-22 DIAGNOSIS — Y929 Unspecified place or not applicable: Secondary | ICD-10-CM | POA: Diagnosis not present

## 2016-04-22 DIAGNOSIS — M25552 Pain in left hip: Secondary | ICD-10-CM

## 2016-04-22 DIAGNOSIS — Y999 Unspecified external cause status: Secondary | ICD-10-CM | POA: Diagnosis not present

## 2016-04-22 MED ORDER — HYDROCODONE-ACETAMINOPHEN 5-325 MG PO TABS
1.0000 | ORAL_TABLET | ORAL | 0 refills | Status: DC | PRN
Start: 2016-04-22 — End: 2018-06-20

## 2016-04-22 MED ORDER — HYDROCODONE-ACETAMINOPHEN 5-325 MG PO TABS
1.0000 | ORAL_TABLET | Freq: Once | ORAL | Status: AC
  Administered 2016-04-22: 1 via ORAL
  Filled 2016-04-22: qty 1
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
## 2016-04-22 NOTE — ED Notes (Signed)
Patient transported to X-ray 

## 2016-04-22 NOTE — ED Provider Notes (Signed)
[facility]-EMERGENCY DEPT Provider Note   CSN: [id number] Arrival date & time: 04/22/16  0620     History   Chief Complaint Chief Complaint  Patient presents with  . Hip Pain    HPI Kyle Simpson is a 40 y.o. male.  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  Pt sees orthopedist in [facility], Dr Louis Matte, and had right hip replaced two months ago.  He walks with cane at baseline.  Occasionally his leg gives out and he falls, several times recently, most significantly yesterday.  Pain is located in the left back and left buttock.  No change in chronic weakness or numbness of the left hip.  Denies fevers, other injury, significant back pain, loss of control of bowel or bladder.  Takes ibuprofen for his pain.  Previously has been given hydrocodone but has none currently.   Past Medical History:  Diagnosis Date  . Avascular necrosis (HCC)   . 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   Past Surgical History:  Procedure Laterality Date  . HERNIA REPAIR         Home Medications    Prior to Admission medications   Medication Sig Start Date End Date Taking? Authorizing Provider  aspirin EC 325 MG tablet Take 650 mg by mouth daily.   Yes Historical Provider, MD  Darunavir Ethanolate (PREZISTA) 800 MG tablet Take 800 mg by mouth daily with breakfast.   Yes Historical Provider, MD  ibuprofen (ADVIL,MOTRIN) 800 MG tablet Take 1 tablet (800 mg total) by mouth 3 (three) times daily. Patient taking differently: Take 800 mg by mouth every 8 (eight) hours as needed for moderate pain.  08/17/13  Yes Shari Upstill, PA-C  rilpivirine (EDURANT) 25 MG TABS tablet Take 25 mg by mouth daily with breakfast.   Yes Historical Provider, MD  ritonavir (NORVIR) 100 MG TABS tablet Take 100 mg by  mouth daily with breakfast.   Yes Historical Provider, MD  tenofovir (VIREAD) 300 MG tablet Take 300 mg by mouth daily.   Yes Historical Provider, MD  HYDROcodone-acetaminophen (NORCO/VICODIN) 5-325 MG tablet Take 1-2 tablets by mouth every 4 (four) hours as needed for moderate pain or severe pain. 04/22/16   Trixie Dredge, PA-C    Family History No family history on file.  Social History Social History  Substance Use Topics  . Smoking status: Current Every Day Smoker  . Smokeless tobacco: Never Used  . Alcohol use Yes     Allergies   Amoxicillin; Penicillins; Toradol [ketorolac tromethamine]; Bactrim; and Sulfa antibiotics   Review of Systems Review of Systems  Constitutional: Negative for chills and fever.  HENT: Negative for congestion and sore throat.   Respiratory: Negative for cough and shortness of breath.   Gastrointestinal: Negative for abdominal pain.  Musculoskeletal: Positive for arthralgias, back pain and gait problem. Negative for joint swelling.  Skin: Negative for color change and wound.  Neurological: Positive for weakness and numbness.  Psychiatric/Behavioral: Negative for self-injury.     Physical Exam Updated Vital Signs BP 121/86   Pulse 72   Temp 97.6 F (36.4 C) (Oral)   Resp 19   Ht 6\' 2"  (1.88 m)   Wt 93 kg   SpO2 99%   BMI 26.32 kg/m   Physical Exam  Constitutional: He appears well-developed and well-nourished. No distress.  HENT:  Head: Normocephalic and atraumatic.  Neck: Neck supple.  Cardiovascular: Intact distal pulses.   Pulmonary/Chest: Effort normal.  Abdominal: Soft. He exhibits no distension. There is no tenderness. There is no rebound and no guarding.  Musculoskeletal: He exhibits no edema or deformity.   Left hip with mild tenderness, pain with passive range of motion.  No erythema, edema, warmth.   Spine nontender, no crepitus, or stepoffs. Lower extremities sensation intact, distal pulses intact.     Neurological: He is  alert.  Skin: He is not diaphoretic.  Nursing note and vitals reviewed.    ED Treatments / Results  Labs (all labs ordered are listed, but only abnormal results are displayed) Labs Reviewed - No data to display  EKG  EKG Interpretation None       Radiology Dg Hip Unilat W Or Wo Pelvis 2-3 Views Left  Result Date: 04/22/2016 CLINICAL DATA:  40 year old male with chronic avascular necrosis presenting with fall and increased left hip pain. EXAM: DG HIP (WITH OR WITHOUT PELVIS) 2-3V LEFT COMPARISON:  Radiograph dated 10/01/2015 FINDINGS: There has been interval total right hip arthroplasty since the prior study. There are advanced avascular necrosis of the left femoral head with flattening of the superior cortex of the left femoral head. These findings or grossly similar to prior study. No acute fracture identified. There is no dislocation. The soft tissues appear unremarkable. IMPRESSION: No acute fracture or dislocation. Advanced avascular necrosis of the left femoral head with flattening of the left femoral head cortex similar to prior exam. Interval right hip arthroplasty. Electronically Signed   By: Elgie CollardArash  Radparvar M.D.   On: 04/22/2016 07:06    Procedures Procedures (including critical care time)  Medications Ordered in ED Medications  HYDROcodone-acetaminophen (NORCO/VICODIN) 5-325 MG per tablet 1 tablet (1 tablet Oral Given 04/22/16 [id number])  HYDROcodone-acetaminophen (NORCO/VICODIN) 5-325 MG per tablet 1 tablet (1 tablet Oral Given 04/22/16 0803)     Initial Impression / Assessment and Plan / ED Course  I have reviewed the triage vital signs and the nursing notes.  Pertinent labs & imaging results that were available during my care of the patient were reviewed by me and considered in my medical decision making (see chart for details).  Clinical Course  Comment By Time  Checked on DEA database.  Monthly prescriptions for Vicodin 10-325 in July and August, none since ConnellEmily  Elaisha Zahniser, [place] 09/28 [id number]   Afebrile, nontoxic patient with chronic avascular necrosis of the left hip injury to his left hip while falling.  Neurovascularly intact.  No e/o septic joint. Xray unchanged from prior.   D/C home with short course of norco, orthopedic follow up.  Discussed result, findings, treatment, and follow up  with patient.  Pt given return precautions.  Pt verbalizes understanding and agrees with plan.      Final Clinical Impressions(s) / ED Diagnoses   Final diagnoses:  Chronic left hip pain    New Prescriptions Discharge Medication List as of 04/22/2016  7:42 AM       Trixie DredgeEmily Rhapsody Wolven, PA-C 04/22/16 1131    Gilda Creasehristopher J Pollina, MD 04/23/16 [phone]

## 2016-04-22 NOTE — Discharge Instructions (Signed)
Read the information below.  Use the prescribed medication as directed.  Please discuss all new medications with your pharmacist.  Do not take additional tylenol while taking the prescribed pain medication to avoid overdose.  You may return to the Emergency Department at any time for worsening condition or any new symptoms that concern you.     If you develop uncontrolled pain, weakness or numbness of the extremity, severe discoloration of the skin, or you are unable to walk or move your leg, return to the ER for a recheck.

## 2016-04-22 NOTE — ED Notes (Signed)
Pt reports that he is not driving but is going up stairs to visit with a family member from there will have a ride home.

## 2016-04-22 NOTE — ED Notes (Signed)
PA at bedside.

## 2016-08-05 ENCOUNTER — Encounter (HOSPITAL_BASED_OUTPATIENT_CLINIC_OR_DEPARTMENT_OTHER): Payer: Self-pay | Admitting: *Deleted

## 2016-08-05 ENCOUNTER — Emergency Department (HOSPITAL_BASED_OUTPATIENT_CLINIC_OR_DEPARTMENT_OTHER)
Admission: EM | Admit: 2016-08-05 | Discharge: 2016-08-05 | Disposition: A | Discharge status: Home / Self Care | Payer: Medicaid Other | Attending: Emergency Medicine | Admitting: Emergency Medicine

## 2016-08-05 DIAGNOSIS — F17210 Nicotine dependence, cigarettes, uncomplicated: Secondary | ICD-10-CM | POA: Insufficient documentation

## 2016-08-05 DIAGNOSIS — L03311 Cellulitis of abdominal wall: Secondary | ICD-10-CM

## 2016-08-05 DIAGNOSIS — Z79899 Other long term (current) drug therapy: Secondary | ICD-10-CM | POA: Insufficient documentation

## 2016-08-05 DIAGNOSIS — Z21 Asymptomatic human immunodeficiency virus [HIV] infection status: Secondary | ICD-10-CM | POA: Diagnosis not present

## 2016-08-05 DIAGNOSIS — L539 Erythematous condition, unspecified: Secondary | ICD-10-CM | POA: Diagnosis present

## 2016-08-05 HISTORY — DX: Human immunodeficiency virus (HIV) disease: B20

## 2016-08-05 HISTORY — DX: Asymptomatic human immunodeficiency virus (hiv) infection status: Z21

## 2016-08-05 MED ORDER — DOXYCYCLINE HYCLATE 100 MG PO CAPS: 100.0000 mg | ORAL_CAPSULE | Freq: Two times a day (BID) | ORAL | 0 refills | Status: DC

## 2016-08-05 NOTE — ED Provider Notes (Signed)
[facility]   CSN: [id number] Arrival date & time: 08/05/16  1149     History   Chief Complaint Chief Complaint  Patient presents with  . Abscess    HPI Kyle Simpson is a 41 y.o. male.  HPI Kyle Simpson is a 41 y.o. male with PMH significant for HIV (undetectable VL), avascular necrosis, and PVD who presents with possible abscess.  Patient states he was "bit by a spider" 2 days ago.  He did not see a spider or live in a heavily wooded area.  He states since then the area on his abdomen has drained purulent material with surrounding erythema.  No fever, chills.  He reports mild intermittent nausea.  He took Tylenol once with some improvement.  Nothing makes it worse.  Past Medical History:  Diagnosis Date  . Avascular necrosis (HCC)   . HIV (human immunodeficiency virus infection) (HCC)   . 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   Past Surgical History:  Procedure Laterality Date  . HERNIA REPAIR         Home Medications    Prior to Admission medications   Medication Sig Start Date End Date Taking? Authorizing Provider  Darunavir Ethanolate (PREZISTA) 800 MG tablet Take 800 mg by mouth daily with breakfast.   Yes Historical Provider, MD  rilpivirine (EDURANT) 25 MG TABS tablet Take 25 mg by mouth daily with breakfast.   Yes Historical Provider, MD  ritonavir (NORVIR) 100 MG TABS tablet Take 100 mg by mouth daily with breakfast.   Yes Historical Provider, MD  tenofovir (VIREAD) 300 MG tablet Take 300 mg by mouth daily.   Yes Historical Provider, MD  aspirin EC 325 MG tablet Take 650 mg by mouth daily.    Historical Provider, MD  HYDROcodone-acetaminophen (NORCO/VICODIN) 5-325 MG tablet Take 1-2 tablets by mouth every 4 (four) hours as needed for moderate pain or severe pain. 04/22/16   Trixie Dredge, PA-C  ibuprofen (ADVIL,MOTRIN) 800 MG tablet Take 1 tablet  (800 mg total) by mouth 3 (three) times daily. Patient taking differently: Take 800 mg by mouth every 8 (eight) hours as needed for moderate pain.  08/17/13   Elpidio Anis, PA-C    Family History No family history on file.  Social History Social History  Substance Use Topics  . Smoking status: Current Every Day Smoker  . Smokeless tobacco: Never Used  . Alcohol use Yes     Allergies   Amoxicillin; Penicillins; Toradol [ketorolac tromethamine]; Bactrim; and Sulfa antibiotics   Review of Systems Review of Systems All other systems negative unless otherwise stated in HPI   Physical Exam Updated Vital Signs BP 121/78 (BP Location: Right Arm)   Pulse 97   Temp 97.8 F (36.6 C) (Oral)   Resp 18   Ht 6\' 2"  (1.88 m)   Wt 99.8 kg   SpO2 98%   BMI 28.25 kg/m   Physical Exam  Constitutional: He is oriented to person, place, and time. He appears well-developed and well-nourished.  HENT:  Head: Normocephalic and atraumatic.  Right Ear: External ear normal.  Left Ear: External ear normal.  Eyes: Conjunctivae are normal. No scleral icterus.  Neck: No tracheal deviation present.  Pulmonary/Chest: Effort normal. No respiratory distress.  Abdominal: He exhibits no distension.  Right anterior mid abdomen with 1 cm area of erythema, induration, and dried blood/pus.  No underlying fluctuance.  Musculoskeletal: Normal range of motion.  Neurological: He is alert and oriented to person, place, and time.  Skin: Skin is warm and dry.  Psychiatric: He has a normal mood and affect. His behavior is normal.     ED Treatments / Results  Labs (all labs ordered are listed, but only abnormal results are displayed) Labs Reviewed - No data to display  EKG  EKG Interpretation None       Radiology No results found.  Procedures Procedures (including critical care time)  Medications Ordered in ED Medications - No data to display   Initial Impression / Assessment and Plan / ED  Course  I have reviewed the triage vital signs and the nursing notes.  Pertinent labs & imaging results that were available during my care of the patient were reviewed by me and considered in my medical decision making (see chart for details).  Clinical Course    Patient presents with purulent cellulitis.  Vitals stable.  Appears well, non-toxic or ill.  I have low suspicion for systemic infection.  Plan to discharge with Doxycyline and zofran for intermittent nausea.  Strict return precautions discussed and patient voices understanding and is agreeable.  Stable for discharge.   Final Clinical Impressions(s) / ED Diagnoses   Final diagnoses:  Cellulitis of abdominal wall    New Prescriptions New Prescriptions   No medications on file     Cheri FowlerKayla Raynette Arras, Cordelia Poche-C 08/05/16 1237    Laurence Spatesachel Morgan Little, MD 08/06/16 [phone]

## 2016-08-05 NOTE — Discharge Instructions (Signed)
Start taking doxycycline twice daily. Apply warm compresses to the area 3 times daily. Take Tylenol for pain. You may take 1000 mg every 6 hours. It may take a day or 2 for the antibiotic to start working. If in 2 days the area seems to be getting larger return to the emergency department. Also, return to the emergency department for any new or concerning symptoms.

## 2016-08-05 NOTE — ED Triage Notes (Signed)
Abscess on his abdomen that opened and drained yesterday. Here today with nausea. Wound site has a scab over it.

## 2016-09-24 MED FILL — HYDROCODON-APAP 7.5-325: 7.5-325 | 7 days supply | Qty: 28 | Fill #0

## 2016-09-24 MED FILL — CLINDAMYCIN HCL 300 MG CAPS: 300 | 1 days supply | Qty: 4 | Fill #0

## 2017-12-27 ENCOUNTER — Emergency Department (HOSPITAL_BASED_OUTPATIENT_CLINIC_OR_DEPARTMENT_OTHER): Payer: Medicaid Other

## 2017-12-27 ENCOUNTER — Other Ambulatory Visit: Payer: Self-pay

## 2017-12-27 ENCOUNTER — Encounter (HOSPITAL_BASED_OUTPATIENT_CLINIC_OR_DEPARTMENT_OTHER): Payer: Self-pay | Admitting: *Deleted

## 2017-12-27 ENCOUNTER — Emergency Department (HOSPITAL_BASED_OUTPATIENT_CLINIC_OR_DEPARTMENT_OTHER)
Admission: EM | Admit: 2017-12-27 | Discharge: 2017-12-27 | Disposition: A | Discharge status: Home / Self Care | Payer: Medicaid Other | Attending: Emergency Medicine | Admitting: Emergency Medicine

## 2017-12-27 DIAGNOSIS — R300 Dysuria: Secondary | ICD-10-CM | POA: Diagnosis not present

## 2017-12-27 DIAGNOSIS — R369 Urethral discharge, unspecified: Secondary | ICD-10-CM | POA: Insufficient documentation

## 2017-12-27 DIAGNOSIS — Z7982 Long term (current) use of aspirin: Secondary | ICD-10-CM | POA: Diagnosis not present

## 2017-12-27 DIAGNOSIS — R350 Frequency of micturition: Secondary | ICD-10-CM | POA: Insufficient documentation

## 2017-12-27 DIAGNOSIS — Z21 Asymptomatic human immunodeficiency virus [HIV] infection status: Secondary | ICD-10-CM | POA: Insufficient documentation

## 2017-12-27 DIAGNOSIS — R110 Nausea: Secondary | ICD-10-CM | POA: Insufficient documentation

## 2017-12-27 DIAGNOSIS — R1032 Left lower quadrant pain: Secondary | ICD-10-CM | POA: Diagnosis not present

## 2017-12-27 DIAGNOSIS — F17200 Nicotine dependence, unspecified, uncomplicated: Secondary | ICD-10-CM | POA: Insufficient documentation

## 2017-12-27 DIAGNOSIS — Z79899 Other long term (current) drug therapy: Secondary | ICD-10-CM | POA: Insufficient documentation

## 2017-12-27 LAB — URINALYSIS, ROUTINE W REFLEX MICROSCOPIC
BILIRUBIN URINE: NEGATIVE
GLUCOSE, UA: NEGATIVE mg/dL
Ketones, ur: NEGATIVE mg/dL
Nitrite: NEGATIVE
Protein, ur: 30 mg/dL — AB
SPECIFIC GRAVITY, URINE: 1.025 (ref 1.005–1.030)
pH: 6 (ref 5.0–8.0)

## 2017-12-27 LAB — CBC WITH DIFFERENTIAL/PLATELET
BASOS ABS: 0 10*3/uL (ref 0.0–0.1)
Basophils Relative: 0 %
EOS PCT: 1 %
Eosinophils Absolute: 0.1 10*3/uL (ref 0.0–0.7)
HEMATOCRIT: 37.9 % — AB (ref 39.0–52.0)
HEMOGLOBIN: 13.1 g/dL (ref 13.0–17.0)
LYMPHS PCT: 20 %
Lymphs Abs: 2.2 10*3/uL (ref 0.7–4.0)
MCH: 30 pg (ref 26.0–34.0)
MCHC: 34.6 g/dL (ref 30.0–36.0)
MCV: 86.9 fL (ref 78.0–100.0)
Monocytes Absolute: 0.8 10*3/uL (ref 0.1–1.0)
Monocytes Relative: 7 %
NEUTROS ABS: 8.2 10*3/uL — AB (ref 1.7–7.7)
Neutrophils Relative %: 72 %
Platelets: 168 10*3/uL (ref 150–400)
RBC: 4.36 MIL/uL (ref 4.22–5.81)
RDW: 15.1 % (ref 11.5–15.5)
WBC: 11.3 10*3/uL — AB (ref 4.0–10.5)

## 2017-12-27 LAB — COMPREHENSIVE METABOLIC PANEL
ALK PHOS: 57 U/L (ref 38–126)
ALT: 21 U/L (ref 17–63)
AST: 23 U/L (ref 15–41)
Albumin: 4 g/dL (ref 3.5–5.0)
Anion gap: 7 (ref 5–15)
BILIRUBIN TOTAL: 0.9 mg/dL (ref 0.3–1.2)
BUN: 8 mg/dL (ref 6–20)
CALCIUM: 8.4 mg/dL — AB (ref 8.9–10.3)
CO2: 25 mmol/L (ref 22–32)
CREATININE: 1.2 mg/dL (ref 0.61–1.24)
Chloride: 104 mmol/L (ref 101–111)
Glucose, Bld: 102 mg/dL — ABNORMAL HIGH (ref 65–99)
Potassium: 3.7 mmol/L (ref 3.5–5.1)
Sodium: 136 mmol/L (ref 135–145)
Total Protein: 7.3 g/dL (ref 6.5–8.1)

## 2017-12-27 LAB — URINALYSIS, MICROSCOPIC (REFLEX): WBC, UA: >50 WBC/hpf (ref 0–5)

## 2017-12-27 LAB — LIPASE, BLOOD: LIPASE: 35 U/L (ref 11–51)

## 2017-12-27 MED ORDER — LIDOCAINE HCL (PF) 1 % IJ SOLN
INTRAMUSCULAR | Status: AC
  Administered 2017-12-27: 5 mL
  Filled 2017-12-27: qty 5

## 2017-12-27 MED ORDER — IOPAMIDOL (ISOVUE-300) INJECTION 61%
100.0000 mL | Freq: Once | INTRAVENOUS | Status: AC | PRN
  Administered 2017-12-27: 100 mL via INTRAVENOUS

## 2017-12-27 MED ORDER — CEFTRIAXONE SODIUM 250 MG IJ SOLR
250.0000 mg | Freq: Once | INTRAMUSCULAR | Status: AC
Start: 2017-12-27 — End: 2017-12-27
  Administered 2017-12-27: 250 mg via INTRAMUSCULAR
  Filled 2017-12-27: qty 250

## 2017-12-27 MED ORDER — SODIUM CHLORIDE 0.9 % IV BOLUS
1000.0000 mL | Freq: Once | INTRAVENOUS | Status: AC
  Administered 2017-12-27: 1000 mL via INTRAVENOUS

## 2017-12-27 MED ORDER — IOPAMIDOL (ISOVUE-300) INJECTION 61%
30.0000 mL | Freq: Once | INTRAVENOUS | Status: AC | PRN
  Administered 2017-12-27: 15 mL via ORAL

## 2017-12-27 MED ORDER — AZITHROMYCIN 250 MG PO TABS
1000.0000 mg | ORAL_TABLET | Freq: Once | ORAL | Status: AC
  Administered 2017-12-27: 1000 mg via ORAL
  Filled 2017-12-27: qty 4

## 2017-12-27 NOTE — ED Notes (Signed)
Penile exam performed by PA.  RN chaperone present.  Tolerated well.

## 2017-12-27 NOTE — ED Notes (Signed)
ED Provider at bedside. 

## 2017-12-27 NOTE — ED Provider Notes (Signed)
[facility] EMERGENCY DEPARTMENT Provider Note   CSN: [id number] Arrival date & time: 12/27/17  1320     History   Chief Complaint Chief Complaint  Patient presents with  . Penile Discharge    HPI Kyle Simpson is a 42 y.o. male.  HPI  Kyle Simpson is a 42 year old male who is HIV positive (CD4 count unknown) currently on daily HAART who presents to the emergency department for evaluation of lower abdominal pain and penile discharge.  Patient reports his symptoms began 2 days ago.  Reports aching 8/10 severity constant suprapubic pain.  He also reports bilateral testicular pain, although "not so bad right now."  States that his abdominal pain is worsened with movement including sitting up.  States that he has been feeling generally unwell for the past 2 days.  Reports penile discharge is white/grey in color and he has associated dysuria and urinary frequency. Reports nausea, no vomiting.  Denies fever that he is aware of.  He reports that he recently had intercourse with a new male partner three days ago, reports condom use at the time. Has not had another sexual partner before that for a year. Denies painful bowel movements, testicular swelling, genital lesion, dysuria, urinary frequency, flank pain, hematuria, chest pain, shortness of breath, cough, lightheadedness, dizziness. Reports he has an appointment with [facility] tomorrow with his infectious disease doctor.   Past Medical History:  Diagnosis Date  . Avascular necrosis (HCC)   . HIV (human immunodeficiency virus infection) (HCC)   . 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   Past Surgical History:  Procedure Laterality Date  . HERNIA REPAIR          Home Medications    Prior to Admission medications   Medication Sig Start Date End Date Taking? Authorizing Provider  aspirin EC 325 MG tablet Take 650 mg by mouth daily.     [provider]  Darunavir Ethanolate (PREZISTA) 800 MG tablet Take 800 mg by mouth daily with breakfast.    [provider]  doxycycline (VIBRAMYCIN) 100 MG capsule Take 1 capsule (100 mg total) by mouth 2 (two) times daily. 08/05/16   Cheri Fowler, PA-C  HYDROcodone-acetaminophen (NORCO/VICODIN) 5-325 MG tablet Take 1-2 tablets by mouth every 4 (four) hours as needed for moderate pain or severe pain. 04/22/16   Trixie Dredge, PA-C  ibuprofen (ADVIL,MOTRIN) 800 MG tablet Take 1 tablet (800 mg total) by mouth 3 (three) times daily. Patient taking differently: Take 800 mg by mouth every 8 (eight) hours as needed for moderate pain.  08/17/13   Elpidio Anis, PA-C  rilpivirine (EDURANT) 25 MG TABS tablet Take 25 mg by mouth daily with breakfast.    [provider]  ritonavir (NORVIR) 100 MG TABS tablet Take 100 mg by mouth daily with breakfast.    [provider]  tenofovir (VIREAD) 300 MG tablet Take 300 mg by mouth daily.    [provider]    Family History No family history on file.  Social History Social History   Tobacco Use  . Smoking status: Current Every Day Smoker  . Smokeless tobacco: Never Used  Substance Use Topics  . Alcohol use: Yes  . Drug use: No     Allergies   Amoxicillin; Penicillins; Toradol [ketorolac tromethamine]; Bactrim; and Sulfa antibiotics   Review of Systems Review of Systems  Constitutional: Negative for chills and fever.  Eyes: Negative for visual disturbance.  Respiratory: Negative for cough and shortness of breath.   Cardiovascular: Negative for chest pain.  Gastrointestinal: Positive for abdominal pain and nausea. Negative for blood in stool, diarrhea and vomiting.  Genitourinary: Positive for discharge, dysuria, frequency, penile pain (with urination) and testicular pain (bilateral). Negative for difficulty urinating, flank pain, genital sores, hematuria and scrotal swelling.  Musculoskeletal: Negative for  back pain.  Skin: Negative for rash.  Neurological: Negative for light-headedness.  Psychiatric/Behavioral: Negative for agitation.     Physical Exam Updated Vital Signs BP (!) 143/104 (BP Location: Right Arm)   Pulse 94   Temp 98.6 F (37 C) (Oral)   Resp 16   Ht 6\' 3"  (1.905 m)   Wt 106.6 kg (235 lb)   SpO2 100%   BMI 29.37 kg/m   Physical Exam  Constitutional: He is oriented to person, place, and time. He appears well-developed and well-nourished. No distress.  Sitting at bedside in no apparent distress, non-toxic appearing.   HENT:  Head: Normocephalic and atraumatic.  Mouth/Throat: Oropharynx is clear and moist. No oropharyngeal exudate.  Eyes: Pupils are equal, round, and reactive to light. Conjunctivae are normal. Right eye exhibits no discharge. Left eye exhibits no discharge.  Neck: Normal range of motion. Neck supple.  Cardiovascular: Normal rate, regular rhythm and intact distal pulses.  No murmur heard. Pulmonary/Chest: Effort normal and breath sounds normal. No stridor. No respiratory distress. He has no wheezes. He has no rales.  Abdominal:  Abdomen soft and nondistended.  Acutely tender to palpation in the left lower quadrant.  No guarding, rigidity or rebound tenderness.  No CVA tenderness.  Genitourinary:  Genitourinary Comments: Chaperone present for exam.  Gray penile discharge present. No testicular mass or swelling. No testicular tenderness on exam. No penile lesion. Cremaster reflex present bilaterally.   Musculoskeletal: Normal range of motion.  Neurological: He is alert and oriented to person, place, and time. Coordination normal.  Skin: Skin is warm and dry. He is not diaphoretic.  Psychiatric: He has a normal mood and affect. His behavior is normal.  Nursing note and vitals reviewed.    ED Treatments / Results  Labs (all labs ordered are listed, but only abnormal results are displayed) Labs Reviewed  URINALYSIS, ROUTINE W REFLEX MICROSCOPIC -  Abnormal; Notable for the following components:      Result Value   APPearance CLOUDY (*)    Hgb urine dipstick LARGE (*)    Protein, ur 30 (*)    Leukocytes, UA LARGE (*)    All other components within normal limits  URINALYSIS, MICROSCOPIC (REFLEX) - Abnormal; Notable for the following components:   Bacteria, UA MANY (*)    All other components within normal limits  CBC WITH DIFFERENTIAL/PLATELET - Abnormal; Notable for the following components:   WBC 11.3 (*)    HCT 37.9 (*)    Neutro Abs 8.2 (*)    All other components within normal limits  COMPREHENSIVE METABOLIC PANEL - Abnormal; Notable for the following components:   Glucose, Bld 102 (*)    Calcium 8.4 (*)    All other components within normal limits  LIPASE, BLOOD  GC/CHLAMYDIA PROBE AMP ([place]) NOT AT [facility]    EKG None  Radiology Ct Abdomen Pelvis W Contrast  Result Date: 12/27/2017 CLINICAL DATA:  Abdominal pain and nausea. EXAM: CT ABDOMEN AND PELVIS WITH CONTRAST TECHNIQUE: Multidetector CT imaging of the abdomen and pelvis was performed using the standard protocol following bolus administration  of intravenous contrast. CONTRAST:  100mL ISOVUE-300 IOPAMIDOL (ISOVUE-300) INJECTION 61%, 15mL ISOVUE-300 IOPAMIDOL (ISOVUE-300) INJECTION 61% COMPARISON:  Body CT 12/05/2005 FINDINGS: Lower chest: No acute abnormality.  Small hiatal hernia. Hepatobiliary: Normal appearance of the liver. Gallbladder is surgically absent or completely decompressed. No evidence of biliary ductal dilation. Pancreas: Unremarkable. No pancreatic ductal dilatation or surrounding inflammatory changes. Spleen: Normal in size without focal abnormality. Adrenals/Urinary Tract: Adrenal glands are unremarkable. Kidneys are normal, without renal calculi, focal lesion, or hydronephrosis. Bladder is unremarkable. Stomach/Bowel: Stomach is within normal limits. Appendix appears normal. No evidence of bowel wall thickening, distention, or inflammatory changes.  Scattered colonic diverticulosis without evidence of acute diverticulitis. Vascular/Lymphatic: No significant vascular findings are present. No enlarged abdominal or pelvic lymph nodes. Reproductive: Prostate gland is largely obscured by streak artifact due to presence of bilateral hip prosthesis. Other: None. Musculoskeletal: Post bilateral hip arthroplasty with normal alignment of the orthopedic hardware. No evidence of fracture or aggressive osseous lesions. IMPRESSION: Small hiatal hernia. Colonic diverticulosis without evidence of diverticulitis. No CT evidence of acute abnormalities within the solid abdominal organs. Electronically Signed   By: Ted Mcalpineobrinka  Dimitrova M.D.   On: 12/27/2017 16:43    Procedures Procedures (including critical care time)  Medications Ordered in ED Medications  azithromycin (ZITHROMAX) tablet 1,000 mg (has no administration in time range)  cefTRIAXone (ROCEPHIN) injection 250 mg (has no administration in time range)  sodium chloride 0.9 % bolus 1,000 mL (1,000 mLs Intravenous New Bag/Given 12/27/17 1558)  iopamidol (ISOVUE-300) 61 % injection 30 mL (15 mLs Oral Contrast Given 12/27/17 1555)  iopamidol (ISOVUE-300) 61 % injection 100 mL (100 mLs Intravenous Contrast Given 12/27/17 1619)    Initial Impression / Assessment and Plan / ED Course  I have reviewed the triage vital signs and the nursing notes.  Pertinent labs & imaging results that were available during my care of the patient were reviewed by me and considered in my medical decision making (see chart for details).     UA reveals infection. GC/Chlamydia pending. Given patient reports significant lower abdominal pain which is reproducible on exam and also has HIV, will get CT abdomen/pelvis for further evaluation and basic labs. Do not suspect epididymitis or orchitis given no pain with palpation of the testicles.  CT abdomen/pelvis without acute abnormality. CBC reveals mild leukocytosis and shift to the left  with WBC 11.3 NEUT 8.2. CMP without any major electrolyte abnormalities, kidney function normal, liver enzymes normal. Lipase negative.   Given penile discharge and patient's recent sexual activity with a new partner, his UTI is likely from [place] source. No CVA tenderness or fever to suggest pyelonephritis. Patient treated prophylactically with rocephin and azithromycin. Counseled him to have recheck by his PCP in 3 days if his symptoms are not improving and discussed reasons to return to the Emergency department. Counseled him that he will receive a phone call if he tests positive for GC/Chlamydia and that he will need to inform all sexual partners that they will need to be treated. He agrees and voices understanding to above plan.  Final Clinical Impressions(s) / ED Diagnoses   Final diagnoses:  Penile discharge  LLQ abdominal pain    ED Discharge Orders    None       Lawrence MarseillesShrosbree, Monserrate Blaschke J, PA-C 12/28/17 1341    Arby BarrettePfeiffer, Marcy, MD 01/04/18 1139

## 2017-12-27 NOTE — Discharge Instructions (Addendum)
Your urine is infected and you were treated for chlamydia and gonorrhea today.  CT scan was reassuring.  You will get a phone call if you tested positive for chlamydia or gonorrhea in the next 3 days.  If positive you will need to inform all sexual partners.  Please follow-up with your infectious disease doctor as scheduled.  Return to the emergency department if you have any new or concerning symptoms like fever, vomiting, worsening abdominal pain. Follow up with your regular doctor in 3 days if your penile discharge is not improving.

## 2017-12-27 NOTE — ED Triage Notes (Signed)
Abdominal pain x 2 days. Penile discharge.

## 2017-12-28 LAB — GC/CHLAMYDIA PROBE AMP (~~LOC~~) NOT AT ARMC
Chlamydia: NEGATIVE
Neisseria Gonorrhea: POSITIVE — AB

## 2018-06-20 ENCOUNTER — Other Ambulatory Visit: Payer: Self-pay

## 2018-06-20 ENCOUNTER — Emergency Department (HOSPITAL_BASED_OUTPATIENT_CLINIC_OR_DEPARTMENT_OTHER)
Admission: EM | Admit: 2018-06-20 | Discharge: 2018-06-20 | Disposition: A | Discharge status: Home / Self Care | Payer: Medicaid Other | Attending: Emergency Medicine | Admitting: Emergency Medicine

## 2018-06-20 ENCOUNTER — Encounter (HOSPITAL_BASED_OUTPATIENT_CLINIC_OR_DEPARTMENT_OTHER): Payer: Self-pay | Admitting: *Deleted

## 2018-06-20 DIAGNOSIS — Z79899 Other long term (current) drug therapy: Secondary | ICD-10-CM | POA: Diagnosis not present

## 2018-06-20 DIAGNOSIS — J181 Lobar pneumonia, unspecified organism: Secondary | ICD-10-CM | POA: Diagnosis not present

## 2018-06-20 DIAGNOSIS — J189 Pneumonia, unspecified organism: Secondary | ICD-10-CM

## 2018-06-20 DIAGNOSIS — B20 Human immunodeficiency virus [HIV] disease: Secondary | ICD-10-CM | POA: Insufficient documentation

## 2018-06-20 DIAGNOSIS — F17200 Nicotine dependence, unspecified, uncomplicated: Secondary | ICD-10-CM | POA: Diagnosis not present

## 2018-06-20 DIAGNOSIS — M791 Myalgia, unspecified site: Secondary | ICD-10-CM | POA: Diagnosis present

## 2018-06-20 MED ORDER — BENZONATATE 100 MG PO CAPS: 100.0000 mg | ORAL_CAPSULE | Freq: Three times a day (TID) | ORAL | 0 refills | Status: AC | PRN

## 2018-06-20 MED ORDER — AZITHROMYCIN 250 MG PO TABS: 250.0000 mg | ORAL_TABLET | Freq: Every day | ORAL | 0 refills | Status: AC

## 2018-06-20 MED FILL — BENZONATATE 100 MG CAPS: 100 | 7 days supply | Qty: 21 | Fill #0

## 2018-06-20 MED FILL — AZITHROMYCIN 250 MG TABLET: 250 | 5 days supply | Qty: 6 | Fill #0

## 2018-06-20 NOTE — ED Triage Notes (Signed)
Pt has had generalized aches and pains for 4 days, now feels sinus pressure and congestion.

## 2018-06-20 NOTE — ED Provider Notes (Signed)
[facility] EMERGENCY DEPARTMENT Provider Note   CSN: [id number] Arrival date & time: 06/20/18  4098     History   Chief Complaint Chief Complaint  Patient presents with  . Generalized Body Aches    HPI Kyle Simpson is a 42 y.o. male.   Cough  This is a new problem. The current episode started more than 1 week ago. The problem occurs constantly. The problem has been gradually worsening. The cough is non-productive. There has been no fever. Associated symptoms include ear congestion, rhinorrhea, sore throat and shortness of breath. Pertinent negatives include no chest pain. He has tried nothing for the symptoms. The treatment provided no relief. He is a smoker. Past medical history comments: HIV not on medications.    Past Medical History:  Diagnosis Date  . Avascular necrosis (HCC)   . HIV (human immunodeficiency virus infection) (HCC)   . 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   Past Surgical History:  Procedure Laterality Date  . HERNIA REPAIR          Home Medications    Prior to Admission medications   Medication Sig Start Date End Date Taking? Authorizing Provider  rilpivirine (EDURANT) 25 MG TABS tablet Take 25 mg by mouth daily with breakfast.   Yes [provider]  azithromycin (ZITHROMAX) 250 MG tablet Take 1 tablet (250 mg total) by mouth daily. Take first 2 tablets together, then 1 every day until finished. 06/20/18   Kaybree Williams, Barbara Cower, MD  benzonatate (TESSALON) 100 MG capsule Take 1 capsule (100 mg total) by mouth 3 (three) times daily as needed for cough. 06/20/18   Edker Punt, Barbara Cower, MD    Family History History reviewed. No pertinent family history.  Social History Social History   Tobacco Use  . Smoking status: Current Every Day Smoker  . Smokeless tobacco: Never Used  Substance Use Topics  . Alcohol use: Yes  . Drug use: No      Allergies   Amoxicillin; Penicillins; Toradol [ketorolac tromethamine]; Bactrim; and Sulfa antibiotics   Review of Systems Review of Systems  HENT: Positive for rhinorrhea and sore throat.   Respiratory: Positive for cough and shortness of breath.   Cardiovascular: Negative for chest pain.  All other systems reviewed and are negative.    Physical Exam Updated Vital Signs BP (!) 149/100 (BP Location: Left Arm)   Pulse 91   Temp 98.7 F (37.1 C) (Oral)   Resp 18   Ht 6\' 3"  (1.905 m)   Wt 97.5 kg   SpO2 100%   BMI 26.87 kg/m   Physical Exam  Constitutional: He is oriented to person, place, and time. He appears well-developed and well-nourished.  HENT:  Head: Normocephalic and atraumatic.  Eyes: Conjunctivae and EOM are normal.  Neck: Normal range of motion.  Cardiovascular: Normal rate.  Pulmonary/Chest: Effort normal. No respiratory distress. He has rales (RUL).  Abdominal: Soft. He exhibits no distension.  Musculoskeletal: Normal range of motion.  Neurological: He is alert and oriented to person, place, and time. No cranial nerve deficit. Coordination normal.  Skin: Skin is warm and dry.  Nursing note and vitals reviewed.    ED Treatments / Results  Labs (all labs ordered are listed, but only abnormal results are displayed) Labs Reviewed - No data to display  EKG None  Radiology No results found.  Procedures Procedures (including critical care time)  Medications  Ordered in ED Medications - No data to display   Initial Impression / Assessment and Plan / ED Course  I have reviewed the triage vital signs and the nursing notes.  Pertinent labs & imaging results that were available during my care of the patient were reviewed by me and considered in my medical decision making (see chart for details).  High risk for bacterial infection secondary to his HIV status and not be on medication.  This with a crackles right upper lobe will treat for  [facility] pneumonia.  Suspect is likely a viral infection and just needs time.  Afebrile, normal oxygen, normal respiratory rate and not tachycardic or any other evidence of severe infection.  Stable for discharge home.  Final Clinical Impressions(s) / ED Diagnoses   Final diagnoses:  Community acquired pneumonia of right upper lobe of lung [facility](HCC)    ED Discharge Orders         Ordered    azithromycin (ZITHROMAX) 250 MG tablet  Daily     06/20/18 0957    benzonatate (TESSALON) 100 MG capsule  3 times daily PRN     06/20/18 0957           Hurshel Bouillon, Barbara CowerJason, MD 06/20/18 1354

## 2018-06-25 ENCOUNTER — Emergency Department (HOSPITAL_BASED_OUTPATIENT_CLINIC_OR_DEPARTMENT_OTHER)
Admission: EM | Admit: 2018-06-25 | Discharge: 2018-06-25 | Disposition: A | Discharge status: Home / Self Care | Payer: Medicaid Other | Attending: Emergency Medicine | Admitting: Emergency Medicine

## 2018-06-25 ENCOUNTER — Emergency Department (HOSPITAL_BASED_OUTPATIENT_CLINIC_OR_DEPARTMENT_OTHER): Payer: Medicaid Other

## 2018-06-25 ENCOUNTER — Other Ambulatory Visit: Payer: Self-pay

## 2018-06-25 ENCOUNTER — Encounter (HOSPITAL_BASED_OUTPATIENT_CLINIC_OR_DEPARTMENT_OTHER): Payer: Self-pay | Admitting: *Deleted

## 2018-06-25 DIAGNOSIS — Z21 Asymptomatic human immunodeficiency virus [HIV] infection status: Secondary | ICD-10-CM | POA: Insufficient documentation

## 2018-06-25 DIAGNOSIS — Z79899 Other long term (current) drug therapy: Secondary | ICD-10-CM | POA: Diagnosis not present

## 2018-06-25 DIAGNOSIS — R7989 Other specified abnormal findings of blood chemistry: Secondary | ICD-10-CM

## 2018-06-25 DIAGNOSIS — F17200 Nicotine dependence, unspecified, uncomplicated: Secondary | ICD-10-CM | POA: Diagnosis not present

## 2018-06-25 DIAGNOSIS — J069 Acute upper respiratory infection, unspecified: Secondary | ICD-10-CM | POA: Insufficient documentation

## 2018-06-25 DIAGNOSIS — R509 Fever, unspecified: Secondary | ICD-10-CM | POA: Diagnosis present

## 2018-06-25 LAB — CBC WITH DIFFERENTIAL/PLATELET
Abs Immature Granulocytes: 0.02 10*3/uL (ref 0.00–0.07)
BASOS ABS: 0 10*3/uL (ref 0.0–0.1)
BASOS PCT: 0 %
EOS ABS: 0 10*3/uL (ref 0.0–0.5)
EOS PCT: 1 %
HEMATOCRIT: 41.9 % (ref 39.0–52.0)
Hemoglobin: 13.7 g/dL (ref 13.0–17.0)
Immature Granulocytes: 0 %
LYMPHS ABS: 1.9 10*3/uL (ref 0.7–4.0)
Lymphocytes Relative: 28 %
MCH: 28.8 pg (ref 26.0–34.0)
MCHC: 32.7 g/dL (ref 30.0–36.0)
MCV: 88.2 fL (ref 80.0–100.0)
Monocytes Absolute: 0.5 10*3/uL (ref 0.1–1.0)
Monocytes Relative: 7 %
NEUTROS PCT: 64 %
NRBC: 0 % (ref 0.0–0.2)
Neutro Abs: 4.3 10*3/uL (ref 1.7–7.7)
PLATELETS: 197 10*3/uL (ref 150–400)
RBC: 4.75 MIL/uL (ref 4.22–5.81)
RDW: 13.8 % (ref 11.5–15.5)
WBC: 6.8 10*3/uL (ref 4.0–10.5)

## 2018-06-25 LAB — BASIC METABOLIC PANEL
Anion gap: 8 (ref 5–15)
BUN: 15 mg/dL (ref 6–20)
CALCIUM: 8.9 mg/dL (ref 8.9–10.3)
CO2: 24 mmol/L (ref 22–32)
CREATININE: 1.46 mg/dL — AB (ref 0.61–1.24)
Chloride: 105 mmol/L (ref 98–111)
GFR, EST NON AFRICAN AMERICAN: 58 mL/min — AB (ref >60)
GLUCOSE: 93 mg/dL (ref 70–99)
Potassium: 3.7 mmol/L (ref 3.5–5.1)
Sodium: 137 mmol/L (ref 135–145)

## 2018-06-25 LAB — INFLUENZA PANEL BY PCR (TYPE A & B)
Influenza A By PCR: NEGATIVE
Influenza B By PCR: NEGATIVE

## 2018-06-25 LAB — GROUP A STREP BY PCR: GROUP A STREP BY PCR: NOT DETECTED

## 2018-06-25 MED ORDER — SODIUM CHLORIDE 0.9 % IV BOLUS
500.0000 mL | Freq: Once | INTRAVENOUS | Status: AC
  Administered 2018-06-25: 500 mL via INTRAVENOUS

## 2018-06-25 MED ORDER — ACETAMINOPHEN 325 MG PO TABS
650.0000 mg | ORAL_TABLET | Freq: Once | ORAL | Status: AC
  Administered 2018-06-25: 650 mg via ORAL
  Filled 2018-06-25: qty 2

## 2018-06-25 NOTE — ED Notes (Signed)
Patient transported to X-ray 

## 2018-06-25 NOTE — ED Triage Notes (Addendum)
URI, subjective fever x 5-6 days.

## 2018-06-25 NOTE — ED Provider Notes (Signed)
[facility] EMERGENCY DEPARTMENT Provider Note   CSN: [id number] Arrival date & time: 06/25/18  1755     History   Chief Complaint Chief Complaint  Patient presents with  . Fever    HPI Kyle Simpson is a 42 y.o. male with a hx of HIV (currently on Edurant) who presents to the ED for persistent URI sxs and development of subjective fevers since last ER visit 06/20/18. Patient seen in the ED 11/26 for cough, prescribed Azithromycin for CAP due to high risk based on exam, however thought more likely to be viral. He states he completed the abx as prescribed without change, however during abx course he also developed subjective fevers. His URI sxs are persistent and worsened with congestion, sinus pressure, ear pain, sore throat, and productive cough with mucous non bloody sputum. He has additionally tried mucinex without much change. Has some chest pain w/ coughing otherwise none. Some associated dyspnea with coughing spells, otherwise none. Reports poor fluid intake as well as looser stools, non bloody, non mucousy. Denies vomiting, abdominal pain, dysuria, decreased urine output, or neck stiffness.   Patient has not seen his ID provider in > 6 months, he believes his last CD4 count was in the 600s and that his last viral load was non- dectable. He states he is due for an appointment.  HPI  Past Medical History:  Diagnosis Date  . Avascular necrosis (HCC)   . HIV (human immunodeficiency virus infection) (HCC)   . 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   Past Surgical History:  Procedure Laterality Date  . HERNIA REPAIR          Home Medications    Prior to Admission medications   Medication Sig Start Date End Date Taking? Authorizing Provider  azithromycin (ZITHROMAX) 250 MG tablet Take 1 tablet (250 mg total) by mouth daily. Take first 2 tablets together, then 1 every day  until finished. 06/20/18   Mesner, Barbara Cower, MD  benzonatate (TESSALON) 100 MG capsule Take 1 capsule (100 mg total) by mouth 3 (three) times daily as needed for cough. 06/20/18   Mesner, Barbara Cower, MD  rilpivirine (EDURANT) 25 MG TABS tablet Take 25 mg by mouth daily with breakfast.    [provider]    Family History History reviewed. No pertinent family history.  Social History Social History   Tobacco Use  . Smoking status: Current Every Day Smoker  . Smokeless tobacco: Never Used  Substance Use Topics  . Alcohol use: Yes  . Drug use: No     Allergies   Amoxicillin; Penicillins; Toradol [ketorolac tromethamine]; Bactrim; and Sulfa antibiotics   Review of Systems Review of Systems  Constitutional: Positive for fever (subjective).  HENT: Positive for congestion, ear pain and sore throat. Negative for trouble swallowing and voice change.   Respiratory: Positive for cough and shortness of breath (w/coughing).   Cardiovascular: Positive for chest pain (w/ coughing).  Gastrointestinal: Positive for diarrhea (loose stool). Negative for abdominal pain, blood in stool, nausea and vomiting.  Genitourinary: Negative for decreased urine volume and dysuria.  All other systems reviewed and are negative.    Physical Exam Updated Vital Signs BP (!) 151/102 (BP Location: Left Arm)   Pulse (!) 117   Temp 99.8 F (37.7 C) (Oral)   Resp (!) 22   Ht 6\' 3"  (1.905 m)   Wt 97.5 kg   SpO2  100%   BMI 26.87 kg/m   Physical Exam  Constitutional: He appears well-developed and well-nourished.  Non-toxic appearance. No distress.  HENT:  Head: Normocephalic and atraumatic. Head is without raccoon's eyes and without Battle's sign.  Right Ear: Tympanic membrane normal.  Left Ear: Tympanic membrane normal.  Nose: Mucosal edema present.  Mouth/Throat: Uvula is midline. Mucous membranes are dry (mildly). Posterior oropharyngeal erythema present. No oropharyngeal exudate.  Eyes: Pupils are  equal, round, and reactive to light. Conjunctivae and EOM are normal. Right eye exhibits no discharge. Left eye exhibits no discharge.  Neck: Neck supple. No neck rigidity. No edema and no erythema present.  Cardiovascular: Regular rhythm. Tachycardia present.  No murmur heard. Pulmonary/Chest: Effort normal and breath sounds normal. No respiratory distress. He has no wheezes. He has no rhonchi. He has no rales.  Respiration even and unlabored  Abdominal: Soft. He exhibits no distension. There is no tenderness. There is no rebound and no guarding.  Neurological: He is alert.  Clear speech.   Skin: Skin is warm and dry. No rash noted.  Psychiatric: He has a normal mood and affect. His behavior is normal.  Nursing note and vitals reviewed.    ED Treatments / Results  Labs (all labs ordered are listed, but only abnormal results are displayed) Labs Reviewed  BASIC METABOLIC PANEL - Abnormal; Notable for the following components:      Result Value   Creatinine, Ser 1.46 (*)    GFR calc non Af Amer 58 (*)    All other components within normal limits  GROUP A STREP BY PCR  CBC WITH DIFFERENTIAL/PLATELET  INFLUENZA PANEL BY PCR (TYPE A & B)    EKG None  Radiology Dg Chest 2 View  Result Date: 06/25/2018 CLINICAL DATA:  Flu-like symptoms 1 week with cough and fever. EXAM: CHEST - 2 VIEW COMPARISON:  None. FINDINGS: Lungs are adequately inflated without consolidation or effusion. Cardiomediastinal silhouette, bones and soft tissues are unchanged to include moderate degenerative change of the left shoulder. IMPRESSION: No active cardiopulmonary disease. Electronically Signed   By: Elberta Fortis M.D.   On: 06/25/2018 18:53    Procedures Procedures (including critical care time)  Medications Ordered in ED Medications - No data to display   Initial Impression / Assessment and Plan / ED Course  I have reviewed the triage vital signs and the nursing notes.  Pertinent labs & imaging  results that were available during my care of the patient were reviewed by me and considered in my medical decision making (see chart for details).   Patient with history of HIV who presents to the emergency department with URI symptoms and subjective fevers, recently on azithromycin for presumed possible CAP.  On arrival patient is tachycardic to 117, oral temperature 99.8, fluids ordered as patient reports some decreased PO fluid intake seems dry on exam likely the cause of tachycardia, however, w/ 99.8 oral temp rectal temp may be more elevated contributing to tachycardia therefore antipyretics ordered as well.  Otherwise overall reassuring exam.  Nasal congestion noted. Posterior oropharynx is mildly erythematous. No meningismus. No evidence of AOM/AOE/mastoiditis. Lungs are clear to auscultation bilaterally and patient does not appear to be in respiratory distress.  His chest discomfort is reproducible with anterior chest wall palpation.  No dysuria to raise concern for UTI. Abdomen nontender. Will obtain strep and flu swabs, basic labs, and CXR.   CXR negative for infiltrate, doubt pneumonia, additionally no evidence of pneumothorax, masses, effusion, or edema.  Basic labs overall reassuring- no leukocytosis, anemia, or electrolyte disturbance. His creatinine is elevated to 1.46, this is increased from prior 1.20 from 6 months ago- receiving fluids in the ER, PCP recheck of this. Strep test negative, exam is not consistent with RPA/PTA.  Flu swab will not result this visit and patient is outside of tamiflu window. Likely viral process. Patient feeling better in the ER  W/ fluids being administered, HR is trending down, but remains mildly tachycardic  20:00: Patient signed out to Sophia Caccavale PA-C at change of shift pending fluid completion and HR improvement. Plan for likely discharge home w/ recommendation for continued supportive measures, rest, good oral hydration, and  PCP/ ID follow up. Findings  and plan of care discussed with supervising physician Dr. Judd Lienelo who is in agreement.    Final Clinical Impressions(s) / ED Diagnoses   Final diagnoses:  Viral URI    ED Discharge Orders    None       Cherly Andersonetrucelli, Ailine Hefferan R, PA-C 06/26/18 1141    Geoffery Lyonselo, Douglas, MD 06/28/18 0700

## 2018-06-25 NOTE — ED Notes (Signed)
ED Provider at bedside. 

## 2018-06-25 NOTE — Discharge Instructions (Addendum)
You were seen in the emergency department today for cough and fever.  Your work-up in the ER was overall reassuring.  Your chest x-ray was normal.  Your strep swab was negative.  Your labs were all normal with the exception of your creatinine, a measure of kidney function, please have this rechecked by primary care provider within 1 to 2 weeks.    At this time we suspect your condition is viral in nature.  We recommend continued supportive treatment at home with continued use of over-the-counter decongestants, cough medicines, as well as Tylenol for any discomfort.  Sure to stay well-hydrated and rest.  Please follow-up with your primary care provider in the next 2 to 3 days for reevaluation of your symptoms and for further management.  We would also like you to follow-up with your infectious disease doctors within the next 1 week as it is important that you receive continued care for HIV.  Return to the ER for new or worsening symptoms or any other concerns.

## 2018-08-20 ENCOUNTER — Encounter (HOSPITAL_BASED_OUTPATIENT_CLINIC_OR_DEPARTMENT_OTHER): Payer: Self-pay | Admitting: Emergency Medicine

## 2018-08-20 ENCOUNTER — Emergency Department (HOSPITAL_BASED_OUTPATIENT_CLINIC_OR_DEPARTMENT_OTHER)
Admission: EM | Admit: 2018-08-20 | Discharge: 2018-08-20 | Disposition: A | Discharge status: Home / Self Care | Payer: Medicaid Other | Attending: Emergency Medicine | Admitting: Emergency Medicine

## 2018-08-20 ENCOUNTER — Other Ambulatory Visit: Payer: Self-pay

## 2018-08-20 DIAGNOSIS — K2960 Other gastritis without bleeding: Secondary | ICD-10-CM | POA: Insufficient documentation

## 2018-08-20 DIAGNOSIS — B20 Human immunodeficiency virus [HIV] disease: Secondary | ICD-10-CM | POA: Diagnosis not present

## 2018-08-20 DIAGNOSIS — R1013 Epigastric pain: Secondary | ICD-10-CM | POA: Diagnosis not present

## 2018-08-20 DIAGNOSIS — F17200 Nicotine dependence, unspecified, uncomplicated: Secondary | ICD-10-CM | POA: Insufficient documentation

## 2018-08-20 DIAGNOSIS — R109 Unspecified abdominal pain: Secondary | ICD-10-CM | POA: Diagnosis present

## 2018-08-20 DIAGNOSIS — Z79899 Other long term (current) drug therapy: Secondary | ICD-10-CM | POA: Diagnosis not present

## 2018-08-20 LAB — COMPREHENSIVE METABOLIC PANEL
ALT: 19 U/L (ref 0–44)
AST: 24 U/L (ref 15–41)
Albumin: 4.5 g/dL (ref 3.5–5.0)
Alkaline Phosphatase: 57 U/L (ref 38–126)
Anion gap: 5 (ref 5–15)
BILIRUBIN TOTAL: 1 mg/dL (ref 0.3–1.2)
BUN: 11 mg/dL (ref 6–20)
CO2: 26 mmol/L (ref 22–32)
Calcium: 8.9 mg/dL (ref 8.9–10.3)
Chloride: 103 mmol/L (ref 98–111)
Creatinine, Ser: 1.05 mg/dL (ref 0.61–1.24)
GFR calc Af Amer: >60 mL/min (ref >60)
Glucose, Bld: 110 mg/dL — ABNORMAL HIGH (ref 70–99)
Potassium: 3.9 mmol/L (ref 3.5–5.1)
Sodium: 134 mmol/L — ABNORMAL LOW (ref 135–145)
Total Protein: 7.9 g/dL (ref 6.5–8.1)

## 2018-08-20 LAB — CBC
HCT: 45.1 % (ref 39.0–52.0)
Hemoglobin: 14.5 g/dL (ref 13.0–17.0)
MCH: 28.3 pg (ref 26.0–34.0)
MCHC: 32.2 g/dL (ref 30.0–36.0)
MCV: 88.1 fL (ref 80.0–100.0)
NRBC: 0 % (ref 0.0–0.2)
Platelets: 219 10*3/uL (ref 150–400)
RBC: 5.12 MIL/uL (ref 4.22–5.81)
RDW: 14.1 % (ref 11.5–15.5)
WBC: 7.3 10*3/uL (ref 4.0–10.5)

## 2018-08-20 LAB — LIPASE, BLOOD: Lipase: 32 U/L (ref 11–51)

## 2018-08-20 MED ORDER — OMEPRAZOLE 20 MG PO CPDR: 20.0000 mg | DELAYED_RELEASE_CAPSULE | Freq: Every day | ORAL | 0 refills | Status: AC

## 2018-08-20 MED ORDER — LIDOCAINE VISCOUS HCL 2 % MT SOLN
15.0000 mL | Freq: Once | OROMUCOSAL | Status: AC
  Administered 2018-08-20: 15 mL via ORAL
  Filled 2018-08-20: qty 15

## 2018-08-20 MED ORDER — ALUM & MAG HYDROXIDE-SIMETH 200-200-20 MG/5ML PO SUSP
30.0000 mL | Freq: Once | ORAL | Status: AC
  Administered 2018-08-20: 30 mL via ORAL
  Filled 2018-08-20: qty 30

## 2018-08-20 NOTE — ED Triage Notes (Signed)
Patient states that he is having pain to his mid epigastric region x 3 days - the patient reports that it is a throbbing pain and then after he eats it burns  - denies any N /V

## 2018-08-20 NOTE — ED Provider Notes (Signed)
[facility] EMERGENCY DEPARTMENT Provider Note   CSN: [id number] Arrival date & time: 08/20/18  1404     History   Chief Complaint Chief Complaint  Patient presents with  . Abdominal Pain    HPI Kyle Simpson is a 43 y.o. male.  The history is provided by the patient and medical records.  Abdominal Pain  Pain location:  Epigastric Pain quality: burning   Pain radiates to:  Does not radiate Pain severity:  Moderate Onset quality:  Gradual Duration:  3 days Timing:  Constant Progression:  Waxing and waning Chronicity:  New Context: alcohol use   Relieved by:  Nothing Worsened by:  Eating Ineffective treatments:  None tried Associated symptoms: nausea   Associated symptoms: no anorexia, no chest pain, no chills, no constipation, no cough, no diarrhea, no dysuria, no fatigue, no fever, no shortness of breath, no sore throat and no vomiting     Past Medical History:  Diagnosis Date  . Avascular necrosis (HCC)   . HIV (human immunodeficiency virus infection) (HCC)   . 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   Past Surgical History:  Procedure Laterality Date  . HERNIA REPAIR          Home Medications    Prior to Admission medications   Medication Sig Start Date End Date Taking? Authorizing Provider  abacavir-dolutegravir-lamiVUDine (TRIUMEQ) 600-50-300 MG tablet Take by mouth. 06/16/18  Yes [provider]  azithromycin (ZITHROMAX) 250 MG tablet Take 1 tablet (250 mg total) by mouth daily. Take first 2 tablets together, then 1 every day until finished. 06/20/18   Mesner, Barbara Cower, MD  benzonatate (TESSALON) 100 MG capsule Take 1 capsule (100 mg total) by mouth 3 (three) times daily as needed for cough. 06/20/18   Mesner, Barbara Cower, MD  rilpivirine (EDURANT) 25 MG TABS tablet Take 25 mg by mouth daily with breakfast.    [provider]    Family  History History reviewed. No pertinent family history.  Social History Social History   Tobacco Use  . Smoking status: Current Every Day Smoker  . Smokeless tobacco: Never Used  Substance Use Topics  . Alcohol use: Yes  . Drug use: No     Allergies   Amoxicillin; Penicillins; Toradol [ketorolac tromethamine]; Bactrim; and Sulfa antibiotics   Review of Systems Review of Systems  Constitutional: Negative for chills, diaphoresis, fatigue and fever.  HENT: Negative for congestion and sore throat.   Eyes: Negative for visual disturbance.  Respiratory: Negative for cough, chest tightness, shortness of breath and wheezing.   Cardiovascular: Negative for chest pain, palpitations and leg swelling.  Gastrointestinal: Positive for abdominal pain and nausea. Negative for abdominal distention, anal bleeding, anorexia, blood in stool, constipation, diarrhea and vomiting.  Genitourinary: Negative for discharge, dysuria, flank pain, frequency and penile pain.  Musculoskeletal: Negative for back pain, neck pain and neck stiffness.  Skin: Negative for rash and wound.  Neurological: Negative for light-headedness and headaches.  Psychiatric/Behavioral: Negative for agitation.  All other systems reviewed and are negative.    Physical Exam Updated Vital Signs BP (!) 121/92 (BP Location: Left Arm)   Pulse (!) 103   Temp 99.2 F (37.3 C) (Oral)   Resp 18   Ht 6' (1.829 m)   Wt 97.5 kg   SpO2 100%   BMI 29.16 kg/m   Physical Exam Vitals signs and nursing note reviewed.  Constitutional:  Appearance: He is well-developed. He is not ill-appearing, toxic-appearing or diaphoretic.  HENT:     Head: Normocephalic and atraumatic.  Eyes:     Conjunctiva/sclera: Conjunctivae normal.  Neck:     Musculoskeletal: Neck supple.  Cardiovascular:     Rate and Rhythm: Normal rate and regular rhythm.     Heart sounds: No murmur.  Pulmonary:     Effort: Pulmonary effort is normal. No  respiratory distress.     Breath sounds: Normal breath sounds.  Abdominal:     General: Bowel sounds are normal. There is no distension. There are no signs of injury.     Palpations: Abdomen is soft.     Tenderness: There is no abdominal tenderness. There is no right CVA tenderness or left CVA tenderness.  Genitourinary:    Rectum: No tenderness.  Skin:    General: Skin is warm and dry.     Capillary Refill: Capillary refill takes less than 2 seconds.     Findings: No erythema or rash.  Neurological:     General: No focal deficit present.     Mental Status: He is alert.  Psychiatric:        Mood and Affect: Mood normal.      ED Treatments / Results  Labs (all labs ordered are listed, but only abnormal results are displayed) Labs Reviewed  COMPREHENSIVE METABOLIC PANEL - Abnormal; Notable for the following components:      Result Value   Sodium 134 (*)    Glucose, Bld 110 (*)    All other components within normal limits  LIPASE, BLOOD  CBC    EKG None  Radiology No results found.  Procedures Procedures (including critical care time)  Medications Ordered in ED Medications  alum & mag hydroxide-simeth (MAALOX/MYLANTA) 200-200-20 MG/5ML suspension 30 mL (30 mLs Oral Given 08/20/18 1529)    And  lidocaine (XYLOCAINE) 2 % viscous mouth solution 15 mL (15 mLs Oral Given 08/20/18 1529)     Initial Impression / Assessment and Plan / ED Course  I have reviewed the triage vital signs and the nursing notes.  Pertinent labs & imaging results that were available during my care of the patient were reviewed by me and considered in my medical decision making (see chart for details).      Kyle MccallumJason Simpson is a 43 y.o. male with a past medical history significant for HIV and prior vascular necrosis who presents with request abdominal pain for the last 3 days with nausea.  He reports that 4 nights ago he drank "straight [place] vodka" and thinks he may have hurt his stomach.  He  reports his upper abdomen was having discomfort after this began and then it has continued over the last 3 days as he is continued to eat spicy foods and pepperoni pizza.  He does have a remote history of some reflux but has not had problems with it for some time.  He reports that every time he eats it burns but he denies any other constipation, diarrhea, or urinary symptoms.  No recent trauma.  Patient denies a history with his liver, pancreas, or his gallbladder.  He denies history of diverticulitis appendicitis.  He does report he had a stab to his abdomen one time that was superficial and did not have any serious deep injury.  He denies other complaints.  He reports his epigastric discomfort is moderate at this time.  On exam, abdomen is nontender.  Lungs are clear.  Bowel sounds are  normal.  No CVA tenderness.  Chest nontender.  No murmur.  Patient resting comfortably.  High suspicion for alcohol-related gastritis and subsequent irritation from spicy foods and pizza.  Patient will be given a trial of a GI cocktail with lidocaine to see if this helps his symptoms.  Patient had screening laboratory testing performed which was reassuring.  Patient had a normal lipase and metabolic panel showed normal kidney function and normal liver function.  CBC was unremarkable.  If patient is feeling better, anticipate discharge with prescription for Prilosec and PCP follow-up.  Patient encouraged to have bland foods for the next week.  Anticipate discharge if GI cocktail helps.  5:45 PM Patient was feeling better after GI cocktail.  Suspect irritation of his GI tract causing symptoms.  Patient had prescription for Prilosec written however it appears there is an interaction with his HIV medications.  When patient was asked, patient was unwilling to discuss his prior diagnoses or medication regimen with other family and did not want to talk at this time.  Patient reports that he will take a short course of the  Prilosec for the next week and I expressed to him that this may change the efficacy of his other medications implying his HIV medicines may not be as effective.  Patient nodded understanding and will follow-up with his PCP for ongoing management.  Patient encouraged to avoid alcohol and spicy foods as well as follow-up with gastroneurology if his symptoms persist.  Patient no other questions or concerns and was discharged in good condition.   Final Clinical Impressions(s) / ED Diagnoses   Final diagnoses:  Epigastric pain  Reflux gastritis    ED Discharge Orders         Ordered    omeprazole (PRILOSEC) 20 MG capsule  Daily     08/20/18 1741         Clinical Impression: 1. Epigastric pain   2. Reflux gastritis     Disposition: Discharge  Condition: Good  I have discussed the results, Dx and Tx plan with the pt(& family if present). He/she/they expressed understanding and agree(s) with the plan. Discharge instructions discussed at great length. Strict return precautions discussed and pt &/or family have verbalized understanding of the instructions. No further questions at time of discharge.    New Prescriptions   OMEPRAZOLE (PRILOSEC) 20 MG CAPSULE    Take 1 capsule (20 mg total) by mouth daily.    Follow Up: Gastroenterology, Deboraha Sprang [address] [place] [address] [place] [address] [phone]     [facility] [address] [id number] [place] [address] [phone]       Aziel Morgan, Canary Brim, MD 08/20/18 [phone]

## 2018-08-20 NOTE — Discharge Instructions (Signed)
Please follow-up with your primary care physician for further management of your suspect is a reflux flare after the alcohol and spicy food.  Please use the Prilosec for the next week.  It may interact with other medications so please speak with other physicians if you would like to continue the medicine more than the next week.  If any symptoms change or worsen, please return the nearest emergency department.

## 2018-08-20 NOTE — ED Notes (Signed)
Pt verbalized understanding of dc instructions.

## 2018-08-20 NOTE — ED Notes (Signed)
ED Provider at bedside. 

## 2018-08-20 NOTE — ED Notes (Signed)
Pt aware we need urine specimen, unable to give sample at this time

## 2019-01-16 ENCOUNTER — Telehealth: Payer: Self-pay | Admitting: Infectious Diseases

## 2019-01-16 NOTE — Telephone Encounter (Signed)
COVID-19 Pre-Screening Questions:01/16/19 ° °Do you currently have a fever (>100 °F), chills or unexplained body aches? NO ° °Are you currently experiencing new cough, shortness of breath, sore throat, runny nose? NO °•  °Have you recently travelled outside the state of [place] in the last 14 days? NO °•  °Have you been in contact with someone that is currently pending confirmation of Covid19 testing or has been confirmed to have the Covid19 virus?  NO ° °**If the patient answers NO to ALL questions -  advise the patient to please call the clinic before coming to the office should any symptoms develop.  ° ° ° °

## 2019-01-17 ENCOUNTER — Ambulatory Visit: Payer: Medicaid Other

## 2019-01-17 ENCOUNTER — Other Ambulatory Visit: Payer: Medicaid Other

## 2019-01-17 ENCOUNTER — Other Ambulatory Visit: Payer: Self-pay

## 2019-01-17 DIAGNOSIS — Z113 Encounter for screening for infections with a predominantly sexual mode of transmission: Secondary | ICD-10-CM

## 2019-01-17 DIAGNOSIS — Z79899 Other long term (current) drug therapy: Secondary | ICD-10-CM

## 2019-01-17 DIAGNOSIS — B20 Human immunodeficiency virus [HIV] disease: Secondary | ICD-10-CM

## 2019-01-30 ENCOUNTER — Other Ambulatory Visit: Payer: Medicaid Other

## 2019-01-30 ENCOUNTER — Ambulatory Visit: Payer: Medicaid Other

## 2019-02-07 ENCOUNTER — Ambulatory Visit: Payer: Medicaid Other | Admitting: Pharmacist

## 2019-02-07 ENCOUNTER — Encounter: Payer: Medicaid Other | Admitting: Infectious Diseases

## 2019-02-20 ENCOUNTER — Telehealth: Payer: Self-pay | Admitting: Infectious Diseases

## 2019-02-20 NOTE — Telephone Encounter (Signed)
COVID-19 Pre-Screening Questions:02/20/19 ° °Do you currently have a fever (>100 °F), chills or unexplained body aches? NO  ° °Are you currently experiencing new cough, shortness of breath, sore throat, runny nose?NO ° °Have you recently travelled outside the state of [place] in the last 14 days?NO °•  °Have you been in contact with someone that is currently pending confirmation of Covid19 testing or has been confirmed to have the Covid19 virus?  NO ° °**If the patient answers NO to ALL questions -  advise the patient to please call the clinic before coming to the office should any symptoms develop.  ° ° ° °

## 2019-02-21 ENCOUNTER — Telehealth: Payer: Self-pay | Admitting: Pharmacy Technician

## 2019-02-21 ENCOUNTER — Ambulatory Visit: Payer: Medicaid Other | Admitting: Pharmacist

## 2019-02-21 ENCOUNTER — Encounter: Payer: Medicaid Other | Admitting: Infectious Diseases

## 2019-02-21 NOTE — Telephone Encounter (Signed)
[facility] Patient Advocate Encounter  Insurance verification completed.    The patient is insured through [place] [employer] and has a $3 copay.  We will continue to follow to see if copay assistance is needed.  [name] E. Londen Lorge, CPhT [facility] Phone: [phone] Fax:  [phone]   

## 2019-02-28 ENCOUNTER — Other Ambulatory Visit: Payer: Medicaid Other

## 2019-02-28 ENCOUNTER — Ambulatory Visit: Payer: Medicaid Other

## 2019-03-15 ENCOUNTER — Encounter: Payer: Medicaid Other | Admitting: Infectious Diseases

## 2019-03-15 ENCOUNTER — Ambulatory Visit: Payer: Medicaid Other | Admitting: Pharmacist

## 2019-03-17 ENCOUNTER — Emergency Department (HOSPITAL_BASED_OUTPATIENT_CLINIC_OR_DEPARTMENT_OTHER)
Admission: EM | Admit: 2019-03-17 | Discharge: 2019-03-17 | Disposition: A | Discharge status: Home / Self Care | Payer: Medicaid Other | Attending: Emergency Medicine | Admitting: Emergency Medicine

## 2019-03-17 ENCOUNTER — Other Ambulatory Visit: Payer: Self-pay

## 2019-03-17 ENCOUNTER — Encounter (HOSPITAL_BASED_OUTPATIENT_CLINIC_OR_DEPARTMENT_OTHER): Payer: Self-pay | Admitting: Emergency Medicine

## 2019-03-17 DIAGNOSIS — B20 Human immunodeficiency virus [HIV] disease: Secondary | ICD-10-CM | POA: Insufficient documentation

## 2019-03-17 DIAGNOSIS — Z79899 Other long term (current) drug therapy: Secondary | ICD-10-CM | POA: Insufficient documentation

## 2019-03-17 DIAGNOSIS — R220 Localized swelling, mass and lump, head: Secondary | ICD-10-CM | POA: Diagnosis present

## 2019-03-17 DIAGNOSIS — Z885 Allergy status to narcotic agent status: Secondary | ICD-10-CM | POA: Insufficient documentation

## 2019-03-17 DIAGNOSIS — K047 Periapical abscess without sinus: Secondary | ICD-10-CM | POA: Diagnosis not present

## 2019-03-17 DIAGNOSIS — Z882 Allergy status to sulfonamides status: Secondary | ICD-10-CM | POA: Diagnosis not present

## 2019-03-17 DIAGNOSIS — Z880 Allergy status to penicillin: Secondary | ICD-10-CM | POA: Insufficient documentation

## 2019-03-17 DIAGNOSIS — F17210 Nicotine dependence, cigarettes, uncomplicated: Secondary | ICD-10-CM | POA: Insufficient documentation

## 2019-03-17 MED ORDER — CLINDAMYCIN HCL 150 MG PO CAPS: 150.0000 mg | ORAL_CAPSULE | Freq: Four times a day (QID) | ORAL | 0 refills | Status: AC

## 2019-03-17 NOTE — ED Notes (Signed)
Pt verbalized understanding of dc instructions.

## 2019-03-17 NOTE — ED Triage Notes (Signed)
R side facial swelling since yesterday. Reports dental pain on that side as well.

## 2019-03-17 NOTE — ED Provider Notes (Signed)
[facility] EMERGENCY DEPARTMENT Provider Note   CSN: [id number] Arrival date & time: 03/17/19  0906     History   Chief Complaint Chief Complaint  Patient presents with  . Facial Swelling    HPI Kyle Simpson is a 43 y.o. male.  He has a history of HIV which is under good control.  He is complaining of some right upper tooth pain intermittent and new right cheek swelling and tenderness that started yesterday.  Denies trauma.  No fevers or chills.  No nausea vomiting diarrhea.  He thinks this is related to his dental infection.  No nasal congestion or headache.     The history is provided by the patient.  Dental Pain Location:  Upper Upper teeth location:  6/RU cuspid Quality:  Aching and throbbing Severity:  Moderate Onset quality:  Gradual Duration:  2 days Timing:  Constant Progression:  Worsening Chronicity:  New Context: abscess   Context: not recent dental surgery and not trauma   Relieved by:  None tried Worsened by:  Nothing Ineffective treatments:  None tried Associated symptoms: facial pain and facial swelling   Associated symptoms: no congestion, no difficulty swallowing, no drooling, no fever, no headaches, no neck pain, no neck swelling, no oral bleeding and no trismus   Risk factors: immunosuppression     Past Medical History:  Diagnosis Date  . Avascular necrosis (HCC)   . HIV (human immunodeficiency virus infection) (HCC)   . 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   Past Surgical History:  Procedure Laterality Date  . HERNIA REPAIR          Home Medications    Prior to Admission medications   Medication Sig Start Date End Date Taking? Authorizing Provider  abacavir-dolutegravir-lamiVUDine (TRIUMEQ) 600-50-300 MG tablet Take by mouth. 06/16/18   [provider]  azithromycin (ZITHROMAX) 250 MG tablet Take 1 tablet (250 mg total) by  mouth daily. Take first 2 tablets together, then 1 every day until finished. 06/20/18   Mesner, Barbara CowerJason, MD  benzonatate (TESSALON) 100 MG capsule Take 1 capsule (100 mg total) by mouth 3 (three) times daily as needed for cough. 06/20/18   Mesner, Barbara CowerJason, MD  omeprazole (PRILOSEC) 20 MG capsule Take 1 capsule (20 mg total) by mouth daily. 08/20/18   Tegeler, Canary Brimhristopher J, MD  rilpivirine (EDURANT) 25 MG TABS tablet Take 25 mg by mouth daily with breakfast.    [provider]    Family History No family history on file.  Social History Social History   Tobacco Use  . Smoking status: Current Every Day Smoker  . Smokeless tobacco: Never Used  Substance Use Topics  . Alcohol use: Yes  . Drug use: No     Allergies   Amoxicillin, Penicillins, Toradol [ketorolac tromethamine], Bactrim, and Sulfa antibiotics   Review of Systems Review of Systems  Constitutional: Negative for fever.  HENT: Positive for facial swelling. Negative for congestion and drooling.   Musculoskeletal: Negative for neck pain.  Neurological: Negative for headaches.     Physical Exam Updated Vital Signs BP 139/72 (BP Location: Right Arm)   Pulse 93   Temp 98.3 F (36.8 C) (Oral)   Resp 18   SpO2 98%   Physical Exam Vitals signs and nursing note reviewed.  Constitutional:      Appearance: He is well-developed.  HENT:     Head: Atraumatic.  Nose: Nose normal.     Mouth/Throat:     Mouth: Mucous membranes are moist.     Pharynx: Oropharynx is clear.   Eyes:     Conjunctiva/sclera: Conjunctivae normal.  Neck:     Musculoskeletal: Neck supple.  Pulmonary:     Effort: Pulmonary effort is normal.  Skin:    General: Skin is warm and dry.  Neurological:     Mental Status: He is alert.     GCS: GCS eye subscore is 4. GCS verbal subscore is 5. GCS motor subscore is 6.      ED Treatments / Results  Labs (all labs ordered are listed, but only abnormal results are displayed) Labs  Reviewed - No data to display  EKG None  Radiology No results found.  Procedures Procedures (including critical care time)  Medications Ordered in ED Medications - No data to display   Initial Impression / Assessment and Plan / ED Course  I have reviewed the triage vital signs and the nursing notes.  Pertinent labs & imaging results that were available during my care of the patient were reviewed by me and considered in my medical decision making (see chart for details).        Facial swelling, likely dental in origin. Will cover with abx. Nontoxic appearing. No trismus  Final Clinical Impressions(s) / ED Diagnoses   Final diagnoses:  Dental abscess    ED Discharge Orders         Ordered    clindamycin (CLEOCIN) 150 MG capsule  Every 6 hours     03/17/19 0927           Hayden Rasmussen, MD 03/17/19 [phone]

## 2019-04-10 ENCOUNTER — Other Ambulatory Visit: Payer: Medicaid Other

## 2019-04-10 ENCOUNTER — Ambulatory Visit: Payer: Medicaid Other

## 2019-04-25 ENCOUNTER — Ambulatory Visit: Payer: Medicaid Other | Admitting: Pharmacist

## 2019-04-25 ENCOUNTER — Encounter: Payer: Medicaid Other | Admitting: Infectious Diseases

## 2019-04-25 NOTE — Telephone Encounter (Signed)
[facility] Patient Advocate Encounter  Patient's insurance is still active [employer]

## 2019-05-02 ENCOUNTER — Telehealth: Payer: Self-pay | Admitting: *Deleted

## 2019-05-02 NOTE — Telephone Encounter (Signed)
Sounds good. Thank you

## 2019-05-02 NOTE — Telephone Encounter (Signed)
-----   Message from Janine Ores, MD sent at 05/01/2019  1:07 PM EDT ----- I got a slew of "overdue results" on this pt today. He no showed for his recent visit with me and never had any of his intake labs drawn. Can someone reach out to him to get these rescheduled?  Thanks ----- Message ----- From: SYSTEM Sent: 05/01/2019  12:04 AM EDT To: Janine Ores, MD

## 2019-05-02 NOTE — Telephone Encounter (Signed)
I will notify the [facility] Counselor to bring him into care. Thank you! Sharyn Lull

## 2019-05-14 ENCOUNTER — Other Ambulatory Visit: Payer: Medicaid Other

## 2019-05-14 ENCOUNTER — Ambulatory Visit: Payer: Medicaid Other

## 2019-05-14 ENCOUNTER — Other Ambulatory Visit: Payer: Self-pay | Admitting: *Deleted

## 2019-05-14 DIAGNOSIS — Z79899 Other long term (current) drug therapy: Secondary | ICD-10-CM

## 2019-05-14 DIAGNOSIS — B20 Human immunodeficiency virus [HIV] disease: Secondary | ICD-10-CM

## 2019-05-14 DIAGNOSIS — Z113 Encounter for screening for infections with a predominantly sexual mode of transmission: Secondary | ICD-10-CM

## 2019-05-29 ENCOUNTER — Ambulatory Visit: Payer: Medicaid Other | Admitting: Pharmacist

## 2019-05-29 ENCOUNTER — Encounter: Payer: Medicaid Other | Admitting: Infectious Diseases

## 2019-05-30 ENCOUNTER — Telehealth: Payer: Self-pay

## 2019-05-30 NOTE — Telephone Encounter (Signed)
Attempted to contact patient to reschedule missed appointments

## 2019-11-13 ENCOUNTER — Encounter (HOSPITAL_BASED_OUTPATIENT_CLINIC_OR_DEPARTMENT_OTHER): Payer: Self-pay

## 2019-11-13 ENCOUNTER — Other Ambulatory Visit: Payer: Self-pay

## 2019-11-13 ENCOUNTER — Emergency Department (HOSPITAL_BASED_OUTPATIENT_CLINIC_OR_DEPARTMENT_OTHER)
Admission: EM | Admit: 2019-11-13 | Discharge: 2019-11-13 | Disposition: A | Discharge status: Home / Self Care | Payer: Medicaid Other | Attending: Emergency Medicine | Admitting: Emergency Medicine

## 2019-11-13 DIAGNOSIS — Z880 Allergy status to penicillin: Secondary | ICD-10-CM | POA: Diagnosis not present

## 2019-11-13 DIAGNOSIS — B20 Human immunodeficiency virus [HIV] disease: Secondary | ICD-10-CM | POA: Diagnosis not present

## 2019-11-13 DIAGNOSIS — M25512 Pain in left shoulder: Secondary | ICD-10-CM | POA: Diagnosis present

## 2019-11-13 DIAGNOSIS — F17210 Nicotine dependence, cigarettes, uncomplicated: Secondary | ICD-10-CM | POA: Diagnosis not present

## 2019-11-13 DIAGNOSIS — Z79899 Other long term (current) drug therapy: Secondary | ICD-10-CM | POA: Diagnosis not present

## 2019-11-13 DIAGNOSIS — Z882 Allergy status to sulfonamides status: Secondary | ICD-10-CM | POA: Insufficient documentation

## 2019-11-13 DIAGNOSIS — Z23 Encounter for immunization: Secondary | ICD-10-CM | POA: Insufficient documentation

## 2019-11-13 DIAGNOSIS — Z885 Allergy status to narcotic agent status: Secondary | ICD-10-CM | POA: Insufficient documentation

## 2019-11-13 DIAGNOSIS — Z881 Allergy status to other antibiotic agents status: Secondary | ICD-10-CM | POA: Insufficient documentation

## 2019-11-13 MED ORDER — TETANUS-DIPHTH-ACELL PERTUSSIS 5-2.5-18.5 LF-MCG/0.5 IM SUSP
0.5000 mL | Freq: Once | INTRAMUSCULAR | Status: AC
  Administered 2019-11-13: 0.5 mL via INTRAMUSCULAR
  Filled 2019-11-13: qty 0.5

## 2019-11-13 MED ORDER — METHOCARBAMOL 500 MG PO TABS: 500.0000 mg | ORAL_TABLET | Freq: Two times a day (BID) | ORAL | 0 refills | Status: AC | PRN

## 2019-11-13 MED ORDER — PREDNISONE 50 MG PO TABS: 50.0000 mg | ORAL_TABLET | Freq: Every day | ORAL | 0 refills | Status: AC

## 2019-11-13 NOTE — Discharge Instructions (Signed)
Take prednisone as prescribed.  Do not take other anti-inflammatories at the same time (Advil, Motrin, ibuprofen, Aleve) unless you are taking it on a full stomach.  You may need medicine for heartburn/reflux while taking this combination of medicines. You may supplement with Tylenol if you need further pain control. Use Robaxin as needed for muscle stiffness or soreness. Have caution, as this may make you tired or groggy. Do not drive or operate heavy machinery while taking this medication.  Use muscle creams (bengay, icy hot, salonpas) as needed for pain.  Follow up with your primary care doctor if pain is not improving with this treatment.  Return to the ER if you develop high fevers, numbness, loss of bowel or bladder control, or any new or concerning symptoms.
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
## 2019-11-25 ENCOUNTER — Emergency Department (HOSPITAL_COMMUNITY): Payer: Medicaid Other

## 2019-11-25 ENCOUNTER — Emergency Department (HOSPITAL_COMMUNITY)
Admission: EM | Admit: 2019-11-25 | Discharge: 2019-11-26 | Disposition: A | Discharge status: Home / Self Care | Payer: Medicaid Other | Attending: Emergency Medicine | Admitting: Emergency Medicine

## 2019-11-25 ENCOUNTER — Encounter (HOSPITAL_COMMUNITY): Payer: Self-pay

## 2019-11-25 DIAGNOSIS — M25572 Pain in left ankle and joints of left foot: Secondary | ICD-10-CM | POA: Insufficient documentation

## 2019-11-25 DIAGNOSIS — Z79899 Other long term (current) drug therapy: Secondary | ICD-10-CM | POA: Diagnosis not present

## 2019-11-25 DIAGNOSIS — Y908 Blood alcohol level of 240 mg/100 ml or more: Secondary | ICD-10-CM | POA: Insufficient documentation

## 2019-11-25 DIAGNOSIS — M542 Cervicalgia: Secondary | ICD-10-CM | POA: Insufficient documentation

## 2019-11-25 DIAGNOSIS — R109 Unspecified abdominal pain: Secondary | ICD-10-CM | POA: Diagnosis not present

## 2019-11-25 DIAGNOSIS — Y999 Unspecified external cause status: Secondary | ICD-10-CM | POA: Insufficient documentation

## 2019-11-25 DIAGNOSIS — R55 Syncope and collapse: Secondary | ICD-10-CM | POA: Insufficient documentation

## 2019-11-25 DIAGNOSIS — Y929 Unspecified place or not applicable: Secondary | ICD-10-CM | POA: Diagnosis not present

## 2019-11-25 DIAGNOSIS — M25519 Pain in unspecified shoulder: Secondary | ICD-10-CM | POA: Diagnosis not present

## 2019-11-25 DIAGNOSIS — R0789 Other chest pain: Secondary | ICD-10-CM | POA: Diagnosis not present

## 2019-11-25 DIAGNOSIS — F17210 Nicotine dependence, cigarettes, uncomplicated: Secondary | ICD-10-CM | POA: Diagnosis not present

## 2019-11-25 DIAGNOSIS — F10929 Alcohol use, unspecified with intoxication, unspecified: Secondary | ICD-10-CM | POA: Insufficient documentation

## 2019-11-25 DIAGNOSIS — Z21 Asymptomatic human immunodeficiency virus [HIV] infection status: Secondary | ICD-10-CM | POA: Diagnosis not present

## 2019-11-25 DIAGNOSIS — Y93I9 Activity, other involving external motion: Secondary | ICD-10-CM | POA: Diagnosis not present

## 2019-11-25 LAB — COMPREHENSIVE METABOLIC PANEL
ALT: 22 U/L (ref 0–44)
AST: 33 U/L (ref 15–41)
Albumin: 3.8 g/dL (ref 3.5–5.0)
Alkaline Phosphatase: 60 U/L (ref 38–126)
Anion gap: 13 (ref 5–15)
BUN: 7 mg/dL (ref 6–20)
CO2: 21 mmol/L — ABNORMAL LOW (ref 22–32)
Calcium: 8.3 mg/dL — ABNORMAL LOW (ref 8.9–10.3)
Chloride: 108 mmol/L (ref 98–111)
Creatinine, Ser: 1.02 mg/dL (ref 0.61–1.24)
GFR calc Af Amer: >60 mL/min (ref >60)
GFR calc non Af Amer: >60 mL/min (ref >60)
Glucose, Bld: 123 mg/dL — ABNORMAL HIGH (ref 70–99)
Potassium: 3.9 mmol/L (ref 3.5–5.1)
Sodium: 142 mmol/L (ref 135–145)
Total Bilirubin: 0.6 mg/dL (ref 0.3–1.2)
Total Protein: 7.9 g/dL (ref 6.5–8.1)

## 2019-11-25 LAB — PROTIME-INR
INR: 1 (ref 0.8–1.2)
Prothrombin Time: 12.8 seconds (ref 11.4–15.2)

## 2019-11-25 LAB — URINALYSIS, ROUTINE W REFLEX MICROSCOPIC
Bilirubin Urine: NEGATIVE
Glucose, UA: NEGATIVE mg/dL
Hgb urine dipstick: NEGATIVE
Ketones, ur: NEGATIVE mg/dL
Leukocytes,Ua: NEGATIVE
Nitrite: NEGATIVE
Protein, ur: NEGATIVE mg/dL
Specific Gravity, Urine: 1.014 (ref 1.005–1.030)
pH: 5 (ref 5.0–8.0)

## 2019-11-25 LAB — CBC
HCT: 45.7 % (ref 39.0–52.0)
Hemoglobin: 14.2 g/dL (ref 13.0–17.0)
MCH: 28.1 pg (ref 26.0–34.0)
MCHC: 31.1 g/dL (ref 30.0–36.0)
MCV: 90.5 fL (ref 80.0–100.0)
Platelets: 190 10*3/uL (ref 150–400)
RBC: 5.05 MIL/uL (ref 4.22–5.81)
RDW: 15.4 % (ref 11.5–15.5)
WBC: 5.7 10*3/uL (ref 4.0–10.5)
nRBC: 0 % (ref 0.0–0.2)

## 2019-11-25 LAB — I-STAT CHEM 8, ED
BUN: 7 mg/dL (ref 6–20)
Calcium, Ion: 0.93 mmol/L — ABNORMAL LOW (ref 1.15–1.40)
Chloride: 109 mmol/L (ref 98–111)
Creatinine, Ser: 1.3 mg/dL — ABNORMAL HIGH (ref 0.61–1.24)
Glucose, Bld: 116 mg/dL — ABNORMAL HIGH (ref 70–99)
HCT: 42 % (ref 39.0–52.0)
Hemoglobin: 14.3 g/dL (ref 13.0–17.0)
Potassium: 4 mmol/L (ref 3.5–5.1)
Sodium: 145 mmol/L (ref 135–145)
TCO2: 24 mmol/L (ref 22–32)

## 2019-11-25 LAB — ETHANOL: Alcohol, Ethyl (B): 301 mg/dL (ref <10)

## 2019-11-25 LAB — RAPID URINE DRUG SCREEN, HOSP PERFORMED
Amphetamines: NOT DETECTED
Barbiturates: NOT DETECTED
Benzodiazepines: NOT DETECTED
Cocaine: NOT DETECTED
Opiates: NOT DETECTED
Tetrahydrocannabinol: POSITIVE — AB

## 2019-11-25 LAB — LACTIC ACID, PLASMA: Lactic Acid, Venous: 1.5 mmol/L (ref 0.5–1.9)

## 2019-11-25 MED ORDER — IOHEXOL 300 MG/ML  SOLN
100.0000 mL | Freq: Once | INTRAMUSCULAR | Status: AC | PRN
  Administered 2019-11-25: 100 mL via INTRAVENOUS
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
## 2019-11-26 LAB — SAMPLE TO BLOOD BANK

## 2020-09-17 ENCOUNTER — Encounter (HOSPITAL_BASED_OUTPATIENT_CLINIC_OR_DEPARTMENT_OTHER): Payer: Self-pay

## 2020-09-17 ENCOUNTER — Other Ambulatory Visit: Payer: Self-pay

## 2020-09-17 ENCOUNTER — Emergency Department (HOSPITAL_BASED_OUTPATIENT_CLINIC_OR_DEPARTMENT_OTHER)
Admission: EM | Admit: 2020-09-17 | Discharge: 2020-09-18 | Disposition: A | Discharge status: Home / Self Care | Payer: Medicaid Other | Attending: Emergency Medicine | Admitting: Emergency Medicine

## 2020-09-17 ENCOUNTER — Emergency Department (HOSPITAL_BASED_OUTPATIENT_CLINIC_OR_DEPARTMENT_OTHER): Payer: Medicaid Other

## 2020-09-17 DIAGNOSIS — Z21 Asymptomatic human immunodeficiency virus [HIV] infection status: Secondary | ICD-10-CM | POA: Insufficient documentation

## 2020-09-17 DIAGNOSIS — Z79899 Other long term (current) drug therapy: Secondary | ICD-10-CM | POA: Diagnosis not present

## 2020-09-17 DIAGNOSIS — F17210 Nicotine dependence, cigarettes, uncomplicated: Secondary | ICD-10-CM | POA: Diagnosis not present

## 2020-09-17 DIAGNOSIS — R109 Unspecified abdominal pain: Secondary | ICD-10-CM | POA: Diagnosis present

## 2020-09-17 DIAGNOSIS — M549 Dorsalgia, unspecified: Secondary | ICD-10-CM | POA: Insufficient documentation

## 2020-09-17 LAB — COMPREHENSIVE METABOLIC PANEL
ALT: 25 U/L (ref 0–44)
AST: 24 U/L (ref 15–41)
Albumin: 3.9 g/dL (ref 3.5–5.0)
Alkaline Phosphatase: 51 U/L (ref 38–126)
Anion gap: 7 (ref 5–15)
BUN: 14 mg/dL (ref 6–20)
CO2: 24 mmol/L (ref 22–32)
Calcium: 8.7 mg/dL — ABNORMAL LOW (ref 8.9–10.3)
Chloride: 104 mmol/L (ref 98–111)
Creatinine, Ser: 0.86 mg/dL (ref 0.61–1.24)
GFR, Estimated: >60 mL/min (ref >60)
Glucose, Bld: 98 mg/dL (ref 70–99)
Potassium: 4.1 mmol/L (ref 3.5–5.1)
Sodium: 135 mmol/L (ref 135–145)
Total Bilirubin: 0.4 mg/dL (ref 0.3–1.2)
Total Protein: 7.8 g/dL (ref 6.5–8.1)

## 2020-09-17 LAB — CBC WITH DIFFERENTIAL/PLATELET
Abs Immature Granulocytes: 0.01 10*3/uL (ref 0.00–0.07)
Basophils Absolute: 0 10*3/uL (ref 0.0–0.1)
Basophils Relative: 1 %
Eosinophils Absolute: 0.2 10*3/uL (ref 0.0–0.5)
Eosinophils Relative: 4 %
HCT: 39.8 % (ref 39.0–52.0)
Hemoglobin: 13.3 g/dL (ref 13.0–17.0)
Immature Granulocytes: 0 %
Lymphocytes Relative: 49 %
Lymphs Abs: 2.5 10*3/uL (ref 0.7–4.0)
MCH: 28.7 pg (ref 26.0–34.0)
MCHC: 33.4 g/dL (ref 30.0–36.0)
MCV: 85.8 fL (ref 80.0–100.0)
Monocytes Absolute: 0.3 10*3/uL (ref 0.1–1.0)
Monocytes Relative: 6 %
Neutro Abs: 2 10*3/uL (ref 1.7–7.7)
Neutrophils Relative %: 40 %
Platelets: 255 10*3/uL (ref 150–400)
RBC: 4.64 MIL/uL (ref 4.22–5.81)
RDW: 14.3 % (ref 11.5–15.5)
WBC: 5 10*3/uL (ref 4.0–10.5)
nRBC: 0 % (ref 0.0–0.2)

## 2020-09-17 LAB — URINALYSIS, ROUTINE W REFLEX MICROSCOPIC
Bilirubin Urine: NEGATIVE
Glucose, UA: NEGATIVE mg/dL
Hgb urine dipstick: NEGATIVE
Ketones, ur: NEGATIVE mg/dL
Leukocytes,Ua: NEGATIVE
Nitrite: NEGATIVE
Protein, ur: NEGATIVE mg/dL
Specific Gravity, Urine: 1.03 (ref 1.005–1.030)
pH: 6 (ref 5.0–8.0)

## 2020-09-17 MED ORDER — LIDOCAINE 5 % EX PTCH
1.0000 | MEDICATED_PATCH | CUTANEOUS | Status: DC
  Administered 2020-09-17: 1 via TRANSDERMAL
  Filled 2020-09-17: qty 1

## 2020-09-17 MED ORDER — ACETAMINOPHEN 325 MG PO TABS
650.0000 mg | ORAL_TABLET | Freq: Once | ORAL | Status: AC
  Administered 2020-09-17: 650 mg via ORAL
  Filled 2020-09-17: qty 2

## 2020-09-17 MED ORDER — ACETAMINOPHEN ER 650 MG PO TBCR: 650.0000 mg | EXTENDED_RELEASE_TABLET | Freq: Three times a day (TID) | ORAL | 0 refills | Status: AC | PRN

## 2020-09-17 MED ORDER — METHOCARBAMOL 500 MG PO TABS: 500.0000 mg | ORAL_TABLET | Freq: Two times a day (BID) | ORAL | 0 refills | Status: AC

## 2020-09-17 NOTE — Discharge Instructions (Signed)
As discussed, all of your labs and x-rays were negative for any acute abnormalities.  Your symptoms could be related to muscle related pain.  I am sending you home with Tylenol and a muscle relaxer.  You may also purchase over-the-counter Lidoderm patches and Voltaren gel for added pain relief.  Please follow-up with PCP if symptoms not improved within the next week.  Return to the ER for new or worsening symptoms.
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
## 2020-09-17 NOTE — ED Provider Notes (Signed)
[facility] EMERGENCY DEPARTMENT Provider Note   CSN: [id number] Arrival date & time: 09/17/20  2021     History Chief Complaint  Patient presents with  . Flank Pain    Joshuah Minella is a 45 y.o. 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  Patient rates the pain a 10/10 worse with movement and palpation.  Denies associated urinary symptoms.  Denies history of kidney stones.  Denies nausea, vomiting, diarrhea.  No fever or chills.  Denies history of blood clots, recent surgeries, recent long immobilizations, and hormonal treatments.  Denies chest pain and shortness of breath.  Chart reviewed.  Patient was seen at [facility] on 09/15/2020 for the same complaint and had a CT abdomen which was negative for any acute abnormalities.  Patient denies overlying rash.  No injury to left flank region.  He has taking ibuprofen and muscle relaxer with moderate relief.  History obtained from patient and past medical records. No interpreter used during encounter.      Past Medical History:  Diagnosis Date  . Avascular necrosis (HCC)   . HIV (human immunodeficiency virus infection) (HCC)   . 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   Past Surgical History:  Procedure Laterality Date  . HERNIA REPAIR         No family history on file.  Social History   Tobacco Use  . Smoking status: Current Every Day Smoker    Types: Cigarettes, Cigars  . Smokeless tobacco: Never Used  Substance Use Topics  . Alcohol use: Yes    Comment: weekly  . Drug use: Yes    Types: Marijuana    Home Medications Prior to Admission medications   Medication Sig Start Date End Date Taking? Authorizing Provider  acetaminophen  (TYLENOL 8 HOUR) 650 MG CR tablet Take 1 tablet (650 mg total) by mouth every 8 (eight) hours as needed for pain. 09/17/20  Yes Felicie Kocher, Merla Riches, PA-C  methocarbamol (ROBAXIN) 500 MG tablet Take 1 tablet (500 mg total) by mouth 2 (two) times daily. 09/17/20  Yes Mannie Stabile, PA-C  abacavir-dolutegravir-lamiVUDine (TRIUMEQ) 600-50-300 MG tablet Take by mouth. 06/16/18   [provider]  azithromycin (ZITHROMAX) 250 MG tablet Take 1 tablet (250 mg total) by mouth daily. Take first 2 tablets together, then 1 every day until finished. 06/20/18   Mesner, Barbara Cower, MD  benzonatate (TESSALON) 100 MG capsule Take 1 capsule (100 mg total) by mouth 3 (three) times daily as needed for cough. 06/20/18   Mesner, Barbara Cower, MD  clindamycin (CLEOCIN) 150 MG capsule Take 1 capsule (150 mg total) by mouth every 6 (six) hours. 03/17/19   Terrilee Files, MD  gabapentin (NEURONTIN) 300 MG capsule Take by mouth. 11/01/16   [provider]  methocarbamol (ROBAXIN) 500 MG tablet Take 1 tablet (500 mg total) by mouth 2 (two) times daily as needed for muscle spasms. 11/13/19   Caccavale, Sophia, PA-C  omeprazole (PRILOSEC) 20 MG capsule Take 1 capsule (20 mg total) by mouth daily. 08/20/18   Tegeler, Canary Brim, MD  rilpivirine (EDURANT) 25 MG TABS tablet Take 25 mg by mouth daily with breakfast.    [provider]    Allergies  Amoxicillin, Penicillins, Toradol [ketorolac tromethamine], Bactrim, and Sulfa antibiotics  Review of Systems   Review of Systems  Constitutional: Negative for chills and fever.  Gastrointestinal: Negative for abdominal pain, diarrhea, nausea and vomiting.  Genitourinary: Positive for flank pain. Negative for difficulty urinating, dysuria and hematuria.  Musculoskeletal: Positive for back pain.  All other systems reviewed and are negative.   Physical Exam Updated Vital Signs BP (!) 131/99 (BP Location: Left Arm)   Pulse 72   Temp 98.2 F (36.8 C) (Oral)    Resp 18   Ht 6\' 3"  (1.905 m)   Wt 95.3 kg   SpO2 100%   BMI 26.25 kg/m   Physical Exam Vitals and nursing note reviewed.  Constitutional:      General: He is not in acute distress. HENT:     Head: Normocephalic.  Eyes:     Pupils: Pupils are equal, round, and reactive to light.  Cardiovascular:     Rate and Rhythm: Normal rate and regular rhythm.     Pulses: Normal pulses.     Heart sounds: Normal heart sounds. No murmur heard. No friction rub. No gallop.   Pulmonary:     Effort: Pulmonary effort is normal.     Breath sounds: Normal breath sounds.  Abdominal:     General: Abdomen is flat. There is no distension.     Palpations: Abdomen is soft.     Tenderness: There is no abdominal tenderness. There is left CVA tenderness. There is no guarding or rebound.     Comments: Positive CVA tenderness on the left.  Tenderness throughout left flank region and left back.  Musculoskeletal:     Cervical back: Neck supple.     Comments: No lower extremity edema.   Skin:    Comments: No overlying rash to left flank region.  Neurological:     General: No focal deficit present.  Psychiatric:        Mood and Affect: Mood normal.        Behavior: Behavior normal.     ED Results / Procedures / Treatments   Labs (all labs ordered are listed, but only abnormal results are displayed) Labs Reviewed  COMPREHENSIVE METABOLIC PANEL - Abnormal; Notable for the following components:      Result Value   Calcium 8.7 (*)    All other components within normal limits  URINALYSIS, ROUTINE W REFLEX MICROSCOPIC  CBC WITH DIFFERENTIAL/PLATELET    EKG None  Radiology DG Ribs Unilateral W/Chest Left  Result Date: 09/17/2020 CLINICAL DATA:  Left posterior rib pain. EXAM: LEFT RIBS AND CHEST - 3+ VIEW COMPARISON:  Nov 25, 2019 FINDINGS: A radiopaque marker was placed at the site of the patient's pain. No fracture or other bone lesions are seen involving the ribs. There is no evidence of pneumothorax  or pleural effusion. Both lungs are clear. Heart size and mediastinal contours are within normal limits. IMPRESSION: Negative. Electronically Signed   By: Nov 27, 2019 M.D.   On: 09/17/2020 22:32    Procedures Procedures   Medications Ordered in ED Medications  lidocaine (LIDODERM) 5 % 1 patch (1 patch Transdermal Patch Applied 09/17/20 2234)  acetaminophen (TYLENOL) tablet 650 mg (650 mg Oral Given 09/17/20 2233)    ED Course  I have reviewed the triage vital signs and the nursing notes.  Pertinent labs & imaging results that were available during my care of the patient were reviewed by me and considered in my medical decision making (see chart for  details).    MDM Rules/Calculators/A&P                         45 year old male presents to the ED due to left flank pain x3 days.  Patient was evaluated at [facility] ED on 1/21 and had a CT abdomen performed which was negative for any acute abnormalities.  Upon arrival, patient is afebrile, not tachycardic or hypoxic.  Patient in no acute distress and non-ill-appearing.  Physical exam significant for reproducible tenderness in left flank region with no overlying rash to suggest shingles.  Abdomen soft, nondistended, nontender.  Positive left CVA tenderness.  Routine labs ordered to rule out infectious etiology and electrolyte derangements.  UA to rule out signs of infection and hematuria.  Chest x-ray with left-sided ribs to rule out rib fracture and pneumonia.  Lidoderm patch placed in left flank region and Tylenol given for symptomatic relief.  Will hold off on CT scan given reassuring results 2 days prior.  PERC negative and low risk using Wells criteria.  Low suspicion for PE/DVT.  Pain possibly related to MSK etiology given reproducible nature on exam.  CBC unremarkable with no leukocytosis and normal hemoglobin.  CMP reassuring with normal renal function and no major electrolyte derangements.  Chest x-ray personally reviewed which is  negative for signs pneumonia, pneumothorax or widened mediastinum.  No appreciated fractured ribs.  UA unremarkable with no hematuria or signs of infection.  Doubt pyelonephritis or kidney stones.  Suspect symptoms related to MSK etiology given reproducible nature on exam. Pain improved after lidoderm patch. Patient discharged with pain medication and muscle relaxer.  Instructed patient follow-up with PCP if symptoms not improved within the next week. Strict ED precautions discussed with patient. Patient states understanding and agrees to plan. Patient discharged home in no acute distress and stable vitals. Final Clinical Impression(s) / ED Diagnoses Final diagnoses:  Left flank pain    Rx / DC Orders ED Discharge Orders         Ordered    methocarbamol (ROBAXIN) 500 MG tablet  2 times daily        09/17/20 2340    acetaminophen (TYLENOL 8 HOUR) 650 MG CR tablet  Every 8 hours PRN        09/17/20 2340           Jesusita Oka 09/17/20 2343    Linwood Dibbles, MD 09/18/20 [phone]

## 2020-12-04 ENCOUNTER — Emergency Department (HOSPITAL_COMMUNITY)
Admission: EM | Admit: 2020-12-04 | Discharge: 2020-12-04 | Disposition: A | Discharge status: Home / Self Care | Payer: Medicaid Other | Attending: Emergency Medicine | Admitting: Emergency Medicine

## 2020-12-04 ENCOUNTER — Other Ambulatory Visit: Payer: Self-pay

## 2020-12-04 ENCOUNTER — Emergency Department (HOSPITAL_COMMUNITY): Payer: Medicaid Other

## 2020-12-04 ENCOUNTER — Encounter (HOSPITAL_COMMUNITY): Payer: Self-pay | Admitting: Emergency Medicine

## 2020-12-04 DIAGNOSIS — Z79899 Other long term (current) drug therapy: Secondary | ICD-10-CM | POA: Insufficient documentation

## 2020-12-04 DIAGNOSIS — M25551 Pain in right hip: Secondary | ICD-10-CM | POA: Insufficient documentation

## 2020-12-04 DIAGNOSIS — M25552 Pain in left hip: Secondary | ICD-10-CM | POA: Insufficient documentation

## 2020-12-04 DIAGNOSIS — R519 Headache, unspecified: Secondary | ICD-10-CM | POA: Insufficient documentation

## 2020-12-04 DIAGNOSIS — Z21 Asymptomatic human immunodeficiency virus [HIV] infection status: Secondary | ICD-10-CM | POA: Diagnosis not present

## 2020-12-04 DIAGNOSIS — M25559 Pain in unspecified hip: Secondary | ICD-10-CM

## 2020-12-04 DIAGNOSIS — F17210 Nicotine dependence, cigarettes, uncomplicated: Secondary | ICD-10-CM | POA: Insufficient documentation

## 2020-12-04 MED ORDER — LIDOCAINE 5 % EX PTCH: 1.0000 | MEDICATED_PATCH | CUTANEOUS | 0 refills | Status: AC

## 2020-12-04 MED ORDER — ACETAMINOPHEN 500 MG PO TABS
1000.0000 mg | ORAL_TABLET | Freq: Once | ORAL | Status: AC
  Administered 2020-12-04: 1000 mg via ORAL
  Filled 2020-12-04: qty 2

## 2020-12-04 MED ORDER — LIDOCAINE 5 % EX PTCH
2.0000 | MEDICATED_PATCH | CUTANEOUS | Status: DC
  Administered 2020-12-04: 2 via TRANSDERMAL
  Filled 2020-12-04: qty 2

## 2020-12-04 NOTE — ED Triage Notes (Signed)
Pt arrived via EMS. Pt has ETOH and cannabis on board per EMS. Pt has been complaining of pain in both sides of his hips. Pt has hx of hip replacements. Pt states that his pain is a 7/10 and has made him unable to walk. Pt is normally ambulatory.
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
